# Patient Record
Sex: Male | Born: 1965 | Race: White | Hispanic: No | Marital: Married | Smoking: Never smoker
Health system: Southern US, Community
[De-identification: ages and names within clinical notes are randomized; demographics above are authoritative.]

## PROBLEM LIST (undated history)

## (undated) DIAGNOSIS — G4730 Sleep apnea, unspecified: Secondary | ICD-10-CM

## (undated) DIAGNOSIS — F419 Anxiety disorder, unspecified: Secondary | ICD-10-CM

## (undated) DIAGNOSIS — F32A Depression, unspecified: Secondary | ICD-10-CM

## (undated) DIAGNOSIS — K219 Gastro-esophageal reflux disease without esophagitis: Secondary | ICD-10-CM

## (undated) HISTORY — PX: SHOULDER SURGERY: SHX246

## (undated) HISTORY — PX: OTHER SURGICAL HISTORY: SHX169

---

## 2016-05-02 ENCOUNTER — Other Ambulatory Visit: Payer: Self-pay | Admitting: Family Medicine

## 2016-05-02 ENCOUNTER — Other Ambulatory Visit (HOSPITAL_COMMUNITY): Payer: Self-pay | Admitting: Family Medicine

## 2016-05-02 DIAGNOSIS — H532 Diplopia: Secondary | ICD-10-CM

## 2016-05-04 ENCOUNTER — Ambulatory Visit (HOSPITAL_COMMUNITY)
Admission: RE | Admit: 2016-05-04 | Discharge: 2016-05-04 | Disposition: A | Discharge status: Home / Self Care | Payer: BLUE CROSS/BLUE SHIELD | Source: Ambulatory Visit | Attending: Family Medicine | Admitting: Family Medicine

## 2016-05-04 DIAGNOSIS — H532 Diplopia: Secondary | ICD-10-CM | POA: Diagnosis not present

## 2016-05-04 MED ORDER — GADOBENATE DIMEGLUMINE 529 MG/ML IV SOLN
20.0000 mL | Freq: Once | INTRAVENOUS | Status: AC | PRN
  Administered 2016-05-04: 20 mL via INTRAVENOUS

## 2016-05-09 ENCOUNTER — Ambulatory Visit (HOSPITAL_COMMUNITY): Payer: Self-pay

## 2017-01-02 ENCOUNTER — Encounter: Payer: Self-pay | Admitting: Sports Medicine

## 2017-01-02 ENCOUNTER — Ambulatory Visit (INDEPENDENT_AMBULATORY_CARE_PROVIDER_SITE_OTHER): Payer: BLUE CROSS/BLUE SHIELD | Admitting: Sports Medicine

## 2017-01-02 VITALS — BP 120/80 | Ht 71.0 in | Wt 222.0 lb

## 2017-01-02 DIAGNOSIS — M25511 Pain in right shoulder: Secondary | ICD-10-CM | POA: Diagnosis not present

## 2017-01-02 NOTE — Progress Notes (Signed)
   Subjective:    Patient ID: Jerry Snyder, male    DOB: 1965/08/03, 51 y.o.   MRN: [id number]  HPI chief complaint: Right shoulder pain  Very pleasant 51 year old male comes in today complaining of 2 months of right shoulder pain. No injury that he can recall but gradual onset of pain that is most noticeable when holding his arm in an abducted and external rotated position. Pain is diffuse along the lateral shoulder. It also causes him some discomfort at night when sleeping on his right side. He does endorse occasional numbness into his forearm and hand but this does not typically occur at the same time that he has shoulder pain. The numbness typically occurs when holding his arm in a dependent position. He does endorse some mild weakness from time to time but that has been going on for several years. He has not had any imaging. His pain does not bother him enough to take any pain medication. He denies neck pain. No prior shoulder surgery. He is here today with his wife.  Past history reviewed Medications reviewed Allergies reviewed     Review of Systems As above     Objective:   Physical Exam  Well-developed, well-nourished. No acute distress. Awake alert and oriented 3. Vital signs reviewed. Sitting comfortable in exam room  Right shoulder: Full active and passive range of motion. Positive painful arc. No tenderness to palpation over the acromioclavicular joint nor at the bicipital groove. Rotator cuff strength is 5/5 in both shoulders. Mildly positive empty can, positive Hawkins. Negative O'Brien's.  Neurological exam: Strength is 5/5 in both upper extremities. No atrophy. 2+ reflexes at the triceps and brachial radialis tendons bilaterally. 2+ reflex at the left biceps and 1+ at the left. Sensation intact to light touch grossly.      Assessment & Plan:  Right shoulder pain likely secondary to rotator cuff impingement Intermittent right arm numbness   I think the patient has 2  separate issues. I'm going to give him a home exercise program consisting of Jobe exercises and scapular stabilization exercises. He will follow-up with me in 3 weeks for reevaluation. If symptoms persist then I would start with getting x-rays of both the cervical spine and the right shoulder. We also discussed the possibility of ultrasound or MRI if symptoms do not improve. He would like to have these done by the end of the year if needed. He will call me with questions or concerns prior to his follow-up visit.

## 2017-01-23 ENCOUNTER — Ambulatory Visit (INDEPENDENT_AMBULATORY_CARE_PROVIDER_SITE_OTHER): Payer: BLUE CROSS/BLUE SHIELD | Admitting: Sports Medicine

## 2017-01-23 ENCOUNTER — Ambulatory Visit
Admission: RE | Admit: 2017-01-23 | Discharge: 2017-01-23 | Disposition: A | Discharge status: Home / Self Care | Payer: BLUE CROSS/BLUE SHIELD | Source: Ambulatory Visit | Attending: Sports Medicine | Admitting: Sports Medicine

## 2017-01-23 ENCOUNTER — Encounter: Payer: Self-pay | Admitting: Sports Medicine

## 2017-01-23 VITALS — BP 120/86 | Ht 71.0 in | Wt 222.0 lb

## 2017-01-23 DIAGNOSIS — M25511 Pain in right shoulder: Secondary | ICD-10-CM

## 2017-01-23 NOTE — Progress Notes (Signed)
   Subjective:    Patient ID: Jerry Snyder, male    DOB: 09-02-1965, 51 y.o.   MRN: [id number]  HPI Chief Complaint:  Right shoulder pain [name] is a 51 year old male here for follow-up on right shoulder pain that has been present for the past 3 months. In the past month he has tried shoulder exercises which he states exacerbated his pain. He reports that after he did 6 out of 9 exercises he had pain for about 5 days for which he used Advil to help with symptoms.  His pain is reporducible with movement and improves when keeping his shoulder immobile.  He denies trauma to the area.     Review of Systems  Musculoskeletal: Negative for joint swelling and neck pain.  Skin: Negative for rash.  Neurological: Negative for weakness and numbness.       Objective:   Physical Exam  Constitutional: He appears well-developed and well-nourished. No distress.  Musculoskeletal:  Right shoulder with full range of motion on active and passive exam.  Positive O'Brien test.  Pain painful arc.  Mildly positive empty can test   Neurological:  2+ reflexes in brachial radialis, biceps and triceps tendons bilaterally  5/5 motor strength in upper and lower extremities bilaterally        Assessment & Plan:   Assessment:  Right shoulder pain Patient was evaluated on 01/02/17 for this issue.  He tried the exercises recommended at the time with worsening of his symptoms.  With positive O'Brien test today patient may have a labrum dysfunction and will need further imaging to better assess degree of dysfunction.  Patient has 5/5 motor strength and 2+ reflexes in upper extremities bilaterally, not likely a nerve impingement as pain is also intermittent and exacerbated with movement, specifically against resistance.  Will start with x-rays of right shoulder and likely proceed with MRI for better assessment.  Plan -Plain films of right shoulder - MRI of right shoulder

## 2017-01-31 ENCOUNTER — Ambulatory Visit
Admission: RE | Admit: 2017-01-31 | Discharge: 2017-01-31 | Disposition: A | Discharge status: Home / Self Care | Payer: BLUE CROSS/BLUE SHIELD | Source: Ambulatory Visit | Attending: Sports Medicine | Admitting: Sports Medicine

## 2017-01-31 DIAGNOSIS — M25511 Pain in right shoulder: Secondary | ICD-10-CM

## 2017-02-05 ENCOUNTER — Telehealth: Payer: Self-pay | Admitting: Sports Medicine

## 2017-02-05 NOTE — Telephone Encounter (Signed)
  Spoke with Tammy SoursGreg on the phone after reviewing the MRI of his right shoulder. No evidence of rotator cuff tear. No obvious labral tear. He does have moderate rotator cuff tendinosis and subacromial bursitis. Patient will return to the office soon for a subacromial cortisone injection and to start formal physical therapy.

## 2017-02-06 ENCOUNTER — Encounter: Payer: Self-pay | Admitting: Sports Medicine

## 2017-02-06 ENCOUNTER — Ambulatory Visit (INDEPENDENT_AMBULATORY_CARE_PROVIDER_SITE_OTHER): Payer: BLUE CROSS/BLUE SHIELD | Admitting: Sports Medicine

## 2017-02-06 VITALS — BP 133/91 | Ht 72.0 in | Wt 216.0 lb

## 2017-02-06 DIAGNOSIS — M7581 Other shoulder lesions, right shoulder: Secondary | ICD-10-CM | POA: Diagnosis not present

## 2017-02-06 MED ORDER — METHYLPREDNISOLONE ACETATE 40 MG/ML IJ SUSP
40.0000 mg | Freq: Once | INTRAMUSCULAR | Status: AC
  Administered 2017-02-06: 40 mg via INTRA_ARTICULAR

## 2017-02-06 NOTE — Progress Notes (Signed)
  Patient presents to the office today for a subacromial cortisone injection into his right shoulder. Please see his previous office notes as well as the telephone note from yesterday for details regarding history, physical exam findings, and MRI findings. Patient will follow-up with me again in 4 weeks for reevaluation. We are going to start some formal physical therapy. If symptoms persist, consider referral to orthopedics to discuss possible rotator cuff debridement and acromioplasty.  Consent obtained and verified. Time-out conducted. Noted no overlying erythema, induration, or other signs of local infection. Skin prepped in a sterile fashion. Topical analgesic spray: Ethyl chloride. Joint: right shoulder (subacromial space) Needle: 25g 1.5 inch Completed without difficulty. Meds: 3cc 1% xylocaine, 1cc (40mg ) depomedrol  Advised to call if fevers/chills, erythema, induration, drainage, or persistent bleeding.

## 2017-03-06 ENCOUNTER — Ambulatory Visit: Payer: BLUE CROSS/BLUE SHIELD | Admitting: Sports Medicine

## 2017-03-24 ENCOUNTER — Ambulatory Visit: Payer: BLUE CROSS/BLUE SHIELD | Admitting: Sports Medicine

## 2017-04-24 ENCOUNTER — Ambulatory Visit: Payer: BLUE CROSS/BLUE SHIELD | Admitting: Sports Medicine

## 2017-04-29 ENCOUNTER — Ambulatory Visit: Payer: BLUE CROSS/BLUE SHIELD | Admitting: Sports Medicine

## 2017-05-19 ENCOUNTER — Ambulatory Visit (INDEPENDENT_AMBULATORY_CARE_PROVIDER_SITE_OTHER): Payer: BLUE CROSS/BLUE SHIELD | Admitting: Sports Medicine

## 2017-05-19 ENCOUNTER — Encounter (INDEPENDENT_AMBULATORY_CARE_PROVIDER_SITE_OTHER): Payer: Self-pay

## 2017-05-19 VITALS — BP 122/82 | Ht 72.0 in | Wt 199.0 lb

## 2017-05-19 DIAGNOSIS — M7581 Other shoulder lesions, right shoulder: Secondary | ICD-10-CM | POA: Diagnosis not present

## 2017-05-19 NOTE — Progress Notes (Signed)
   Subjective:    Patient ID: Jerry Snyder, male    DOB: 1965/04/15, 52 y.o.   MRN: [id number]  HPI   Patient comes in today for follow-up on right shoulder rotator cuff tendinopathy. Previous MRI showed moderate tendinosis of the rotator cuff and mild tendinosis of the biceps tendon but no rotator cuff tear. He continues to have pain with activity as well as nighttime pain.Subacromial cortisone injection was minimally effective. Physical therapy has not been effective either. He is ready to consider surgical options. He is also complaining of some right great toe pain.He started walking recently and has lost 20 pounds as a result. Over the past year or so he's had pain over the dorsal aspect of the first MTP joint of the right foot. No swelling. No erythema. No trauma. No real treatment to date. No prior toe surgery.    Review of Systems    as above Objective:   Physical Exam  Well-developed, well-nourished. No acute distress. Vital signs reviewed  Right shoulder: Full range of motion. Good rotator cuff strength. Mildly positive empty can, positive Hawkins. No atrophy. Neurovascularly intact distally.  Right foot: First MTP joint demonstrates limited active and passive range of motion. There is a tender, palpable spur off of the dorsum of the MTP joint.No soft tissue swelling. No effusion. No erythema.      Assessment & Plan:   Chronic right shoulder pain secondary to rotator cuff tendinopathy Hallux rigidus, right MTP joint  Patient has failed conservative treatment thus far in regards to his right shoulder. I will refer him to Dr. Thurston HoleWainer to discussed the merits of arthroscopic rotator cuff debridement. For his hallux rigidus, he will return to the office with his walking shoes and we will put a first ray post on his insert.

## 2017-05-19 NOTE — Patient Instructions (Signed)
[facility] [address] [place], [place] [phone]  Appt: 05/22/17 at 3:15 pm with Dr. Thurston HoleWainer

## 2018-12-29 ENCOUNTER — Ambulatory Visit (INDEPENDENT_AMBULATORY_CARE_PROVIDER_SITE_OTHER): Payer: BC Managed Care – PPO | Admitting: Sports Medicine

## 2018-12-29 ENCOUNTER — Other Ambulatory Visit: Payer: Self-pay

## 2018-12-29 VITALS — BP 114/80 | Ht 72.0 in | Wt 200.0 lb

## 2018-12-29 DIAGNOSIS — M25511 Pain in right shoulder: Secondary | ICD-10-CM

## 2018-12-29 NOTE — Patient Instructions (Signed)
Voltaren Gel

## 2018-12-30 ENCOUNTER — Encounter: Payer: Self-pay | Admitting: Sports Medicine

## 2018-12-30 NOTE — Progress Notes (Signed)
   Subjective:    Patient ID: Jerry Snyder, male    DOB: 12/18/1965, 53 y.o.   MRN: [id number]  HPI chief complaint: Right shoulder and left middle finger pain  Jerry Snyder comes in today with a couple of different complaints.  Main complaint is right shoulder pain.  He is status post right shoulder arthroscopy done by Dr. Noemi Chapel in 2019 for rotator cuff tendinopathy.  He has done very well postoperatively.  Has been pain-free up until 3 days ago when he "tweaked" his shoulder while moving a heavy branch.  His main complaint is pain at night when sleeping on his side with the arm and shoulder in an abducted position.  He does notice some pain during the day but it is not severe.  He originally noticed some weakness but that has resolved.  He denies numbness or tingling.  He recently purchased a new house and has been doing quite a bit of self renovation on it.  He localizes his pain to the lateral shoulder.  Mild pain in the left shoulder as well. In regards to his left middle finger he is complaining of pain at the PIP joint.  He admits that he likes to crack his knuckles but this makes his discomfort feel better.  He is questioning whether he might have a little bit of early arthritis here.  He has similar pain in his right great toe.  Interim medical history reviewed Medications reviewed Allergies reviewed  Review of Systems    As above Objective:   Physical Exam  Well-developed, well-nourished.  No acute distress.  Awake alert and oriented x3.  Vital signs reviewed.  Right shoulder: Full active and passive range of motion.  No tenderness to palpation.  Negative empty can, negative Hawkins.  Rotator cuff strength is 5/5 and does not reproduce pain.  He does have pain with O'Brien's.  Negative clunk. Neurological exam: Patient has full strength in both upper extremities.  Left finger: Very minimal fusiform swelling at the PIP joint.  Full range of motion.  No effusion.  No tenderness to  palpation.  Skin is intact.  Brisk capillary refill.      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  I recommended a watchful waiting approach at this time.  I have reassured him that his rotator cuff seems to be fine.  However, if he continues to have pain he will return to the office in a week or 2 for an MSK ultrasound of his left shoulder.  For his left middle finger pain I recommended over-the-counter Voltaren gel as needed.  If symptoms worsen then I would consider an x-ray.  Patient will follow-up for ongoing or recalcitrant issues.

## 2019-06-24 ENCOUNTER — Other Ambulatory Visit: Payer: Self-pay

## 2019-06-24 ENCOUNTER — Ambulatory Visit: Payer: BC Managed Care – PPO | Admitting: Sports Medicine

## 2019-06-24 VITALS — BP 120/82 | Ht 72.0 in | Wt 207.0 lb

## 2019-06-24 DIAGNOSIS — M5010 Cervical disc disorder with radiculopathy, unspecified cervical region: Secondary | ICD-10-CM | POA: Diagnosis not present

## 2019-06-24 MED ORDER — PREDNISONE 10 MG PO TABS: ORAL_TABLET | ORAL | 0 refills | Status: DC

## 2019-06-24 NOTE — Progress Notes (Signed)
   Subjective:    Patient ID: Jerry Snyder, male    DOB: [birth year], 54 y.o.   MRN: [id number]  HPI chief complaint: Right shoulder and elbow pain  [name] comes in today complaining of 6 weeks of right shoulder and elbow pain.  No specific injury that he can recall but a gradual onset of pain that is most noticeable with activity.  Specifically, he notes that if he is lifting heavy objects, after a period of time, he will begin to experience aching and discomfort in the right shoulder which will radiate down to the right elbow and into the radial side of the right forearm and wrist.  He does endorse some numbness and tingling with this as well.  He is status post right shoulder rotator cuff repair done several years ago by Dr. Thurston Hole.  He has done well postoperatively.    Review of Systems As above    Objective:   Physical Exam  Well-developed, well-nourished.  No acute distress.  Awake alert and oriented x3.  Vital signs reviewed.  Cervical spine: Good cervical range of motion.  Positive Spurling's to the right.  Right shoulder: Full painless shoulder range of motion.  Rotator cuff strength is 5/5 but does reproduce some mild pain in the right shoulder.  Right elbow: Full painless range of motion.  No effusion.  No tenderness to palpation.  Neurological exam: Strength is 5/5 both upper extremities.  Reflexes are trace but equal at the biceps, triceps, and brachioradialis tendons.  No atrophy.      Assessment & Plan:   Right arm and elbow pain-question cervical radiculopathy  Patient's rotator cuff strength is very good.  He has full painless shoulder range of motion.  Positive Spurling's would suggest cervical radiculopathy.  I am going to put him on a 6-day Sterapred Dosepak and start physical therapy.  He will follow up with me again in 3 weeks for reevaluation.  If symptoms persist, consider merits of diagnostic/therapeutic right shoulder subacromial cortisone injection versus  imaging.  Call with questions or concerns in the interim.

## 2019-07-15 ENCOUNTER — Ambulatory Visit: Payer: BC Managed Care – PPO | Admitting: Sports Medicine

## 2019-09-16 ENCOUNTER — Other Ambulatory Visit: Payer: Self-pay | Admitting: Student

## 2019-09-16 DIAGNOSIS — R140 Abdominal distension (gaseous): Secondary | ICD-10-CM

## 2019-09-16 DIAGNOSIS — R748 Abnormal levels of other serum enzymes: Secondary | ICD-10-CM

## 2019-09-20 ENCOUNTER — Ambulatory Visit
Admission: RE | Admit: 2019-09-20 | Discharge: 2019-09-20 | Disposition: A | Discharge status: Home / Self Care | Payer: BC Managed Care – PPO | Source: Ambulatory Visit | Attending: Student | Admitting: Student

## 2019-09-20 ENCOUNTER — Other Ambulatory Visit: Payer: Self-pay

## 2019-09-20 DIAGNOSIS — R140 Abdominal distension (gaseous): Secondary | ICD-10-CM

## 2019-09-20 DIAGNOSIS — R748 Abnormal levels of other serum enzymes: Secondary | ICD-10-CM | POA: Insufficient documentation

## 2020-12-07 IMAGING — US US ABDOMEN LIMITED
1 series · 14 of 25 positions shown · non-contrast
Comparison: None.

CLINICAL DATA: Abdominal bloating, elevated LFTs

EXAM:
ULTRASOUND ABDOMEN LIMITED RIGHT UPPER QUADRANT

[Series 1: us abdomen limited ruq · 14 of 53 slices shown]
[im 1/53]
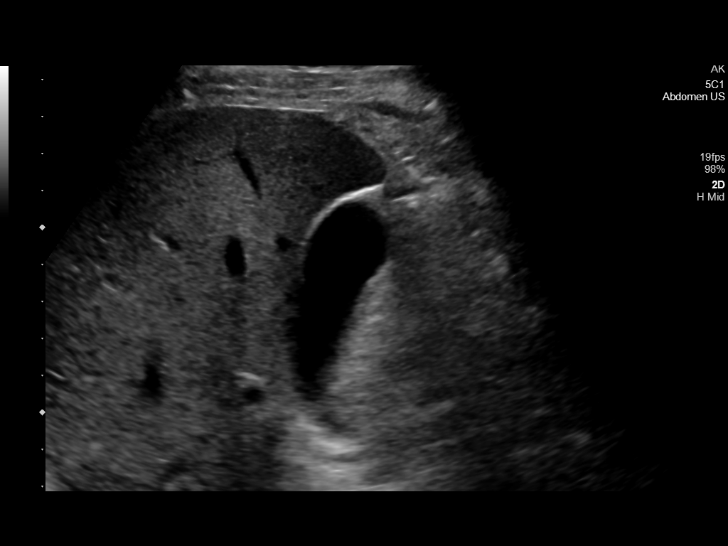
[im 5/53]
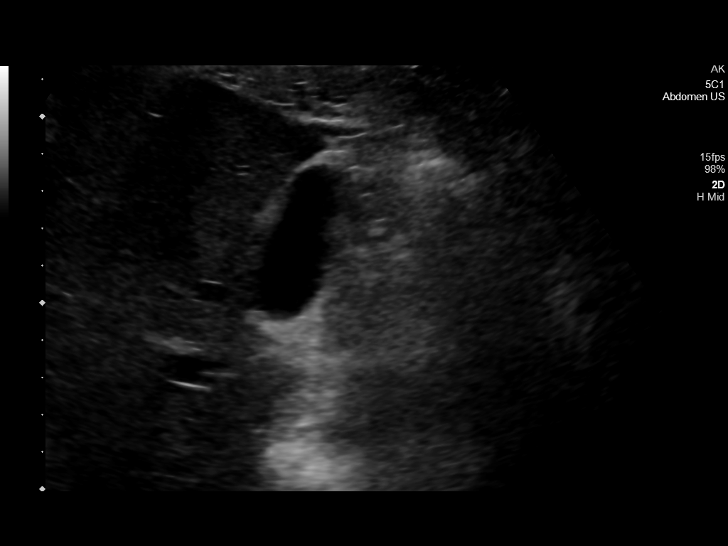
[im 9/53]
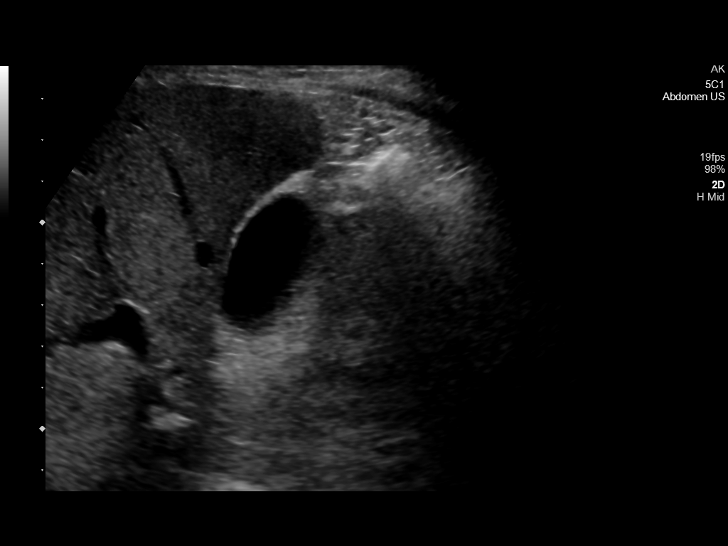
[im 14/53]
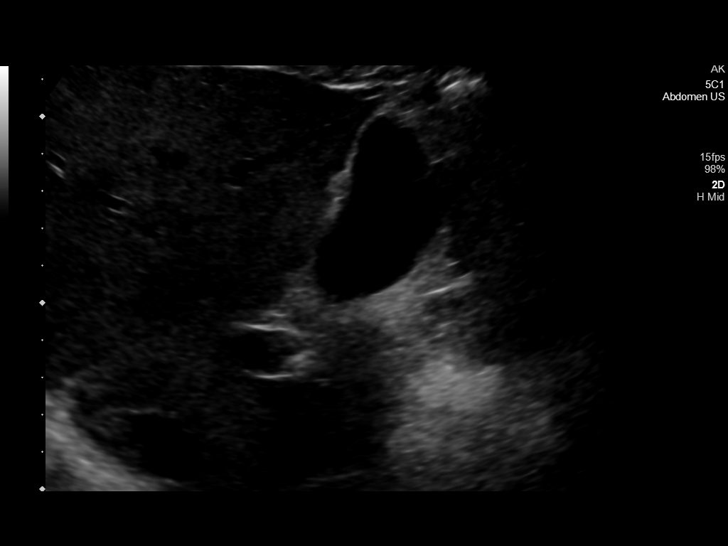
[im 18/53]
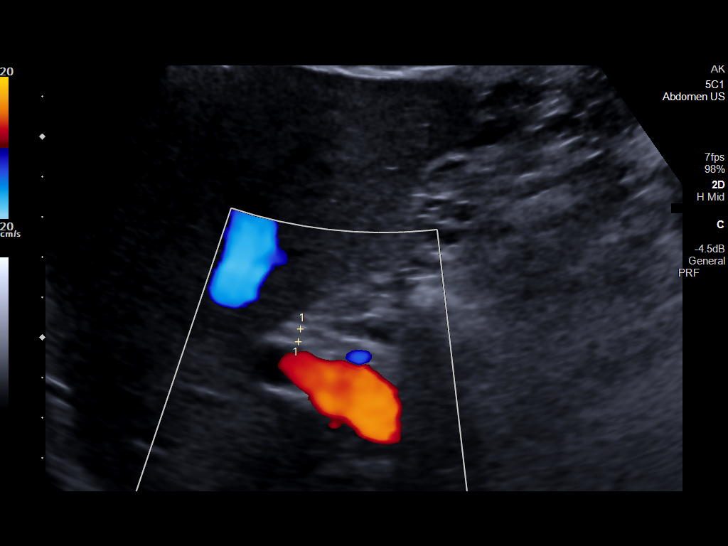
[im 20/53]
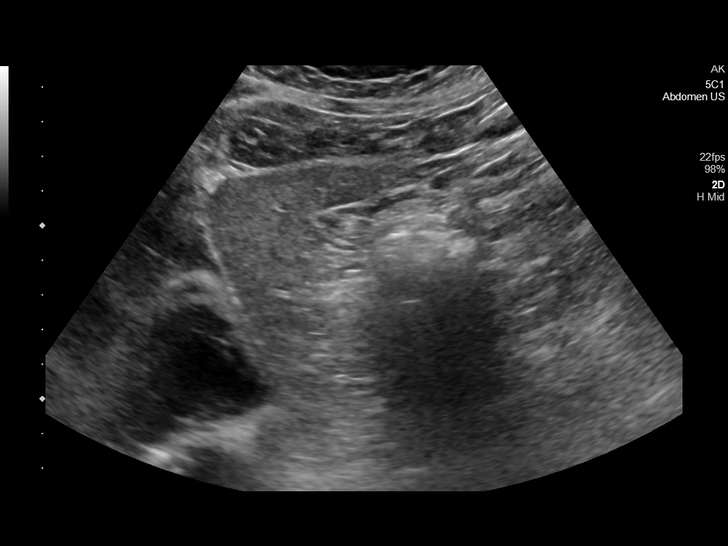
[im 24/53]
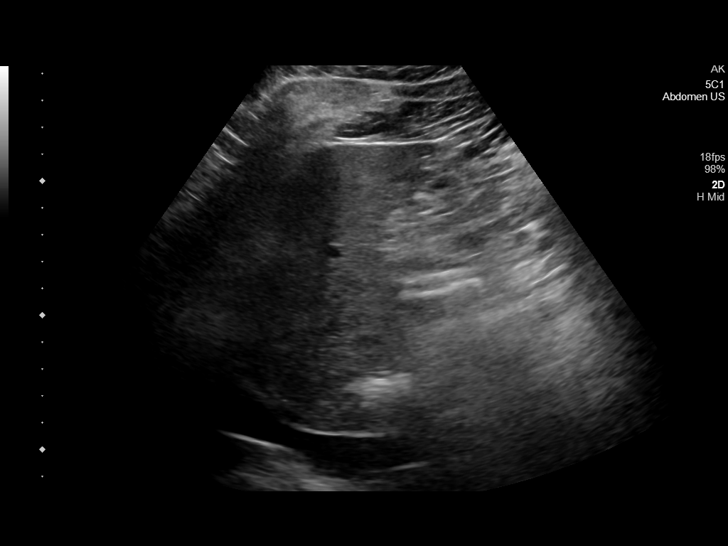
[im 29/53]
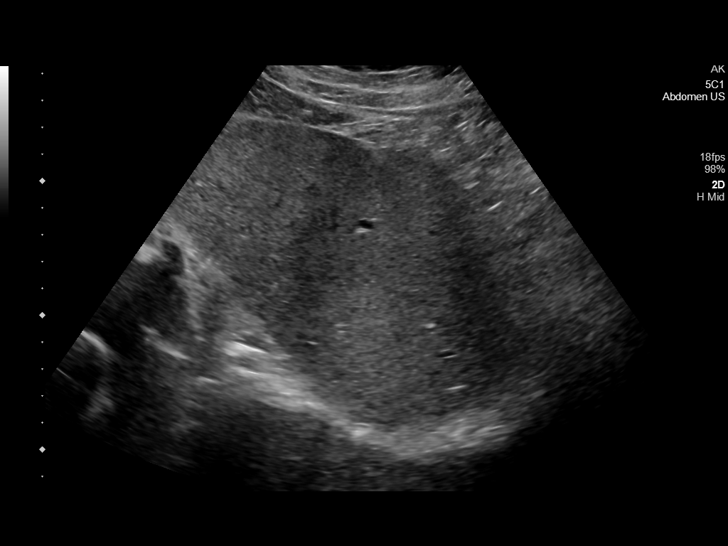
[im 33/53]
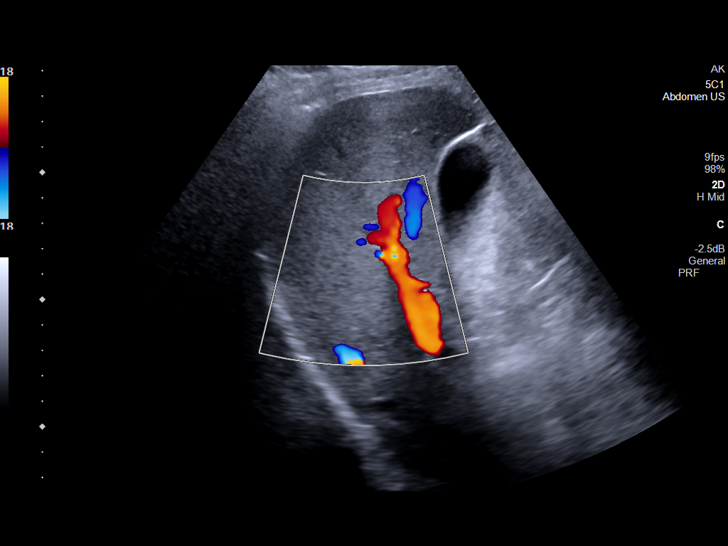
[im 35/53]
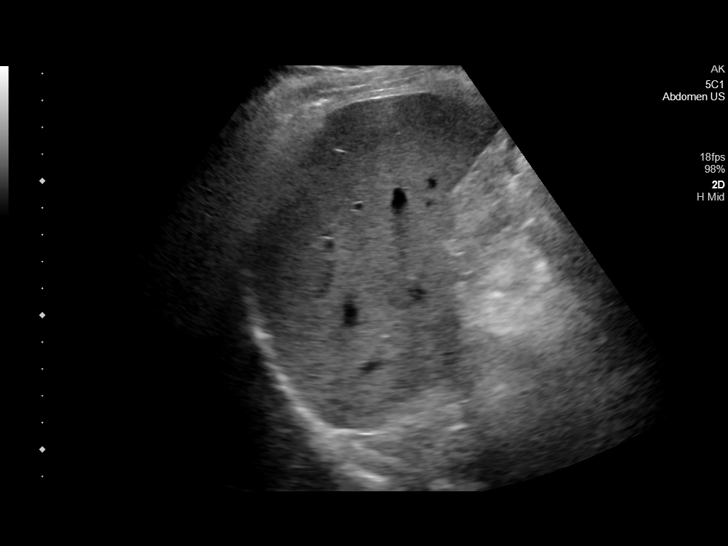
[im 40/53]
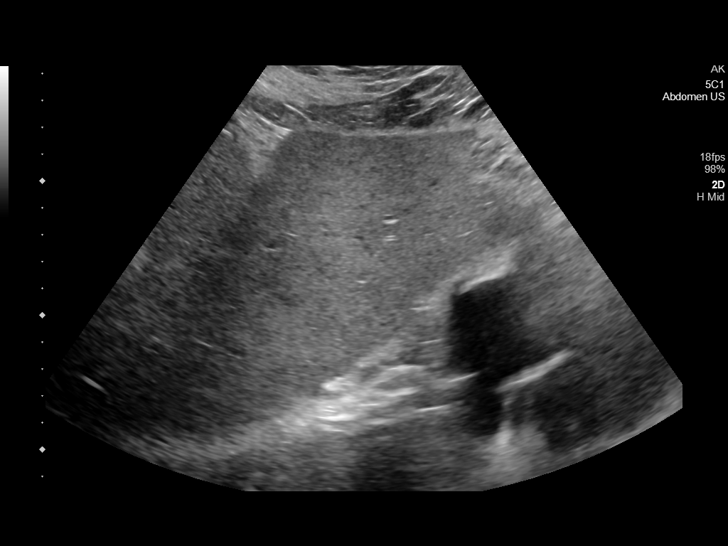
[im 44/53]
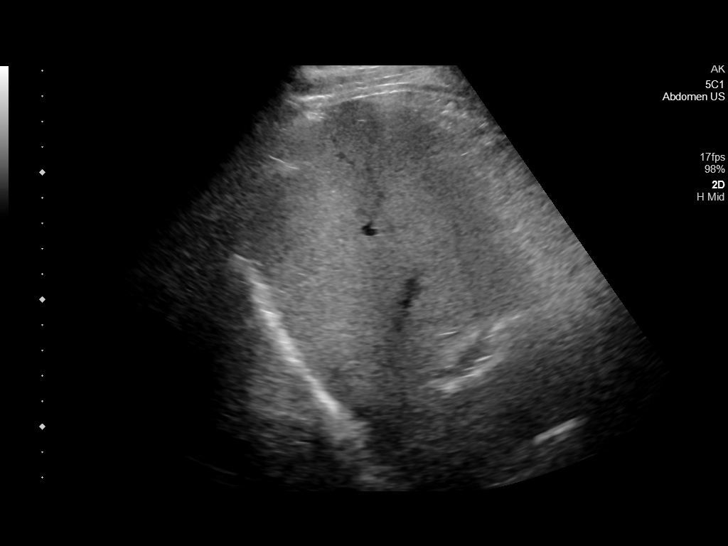
[im 48/53]
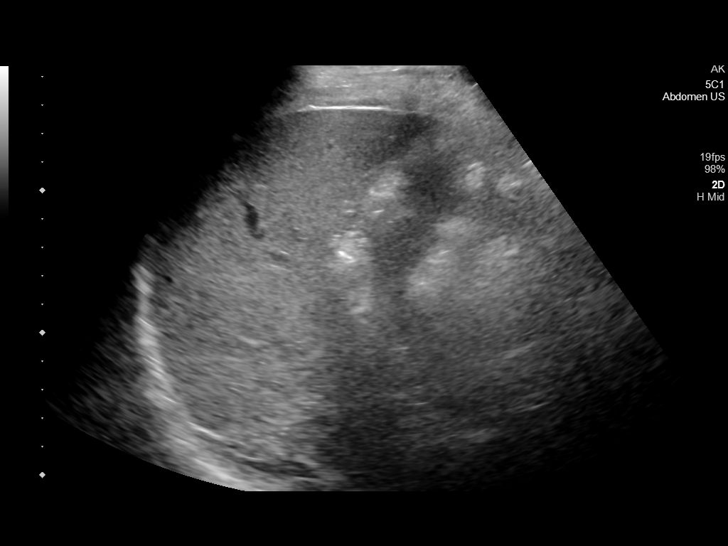
[im 53/53]
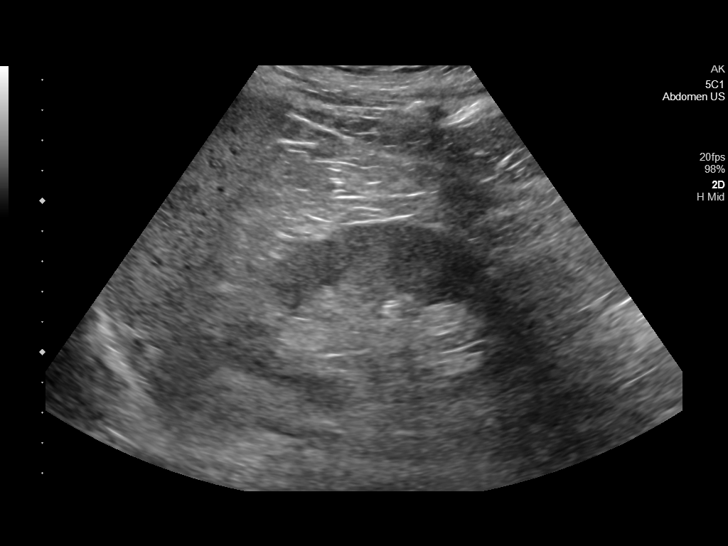

[14 of 25 positions shown; findings below may reference images not displayed]

FINDINGS: Gallbladder:

No gallstones or wall thickening visualized. No sonographic Murphy
sign noted by sonographer.

Common bile duct:

Diameter: 3.2 mm

Liver:

Increased echogenicity compatible with hepatic steatosis. No focal
abnormality or intrahepatic biliary dilatation. Portal vein is
patent on color Doppler imaging with normal direction of blood flow
towards the liver.

Other: No free fluid or ascites
IMPRESSION: Hepatic steatosis.  No other acute finding by ultrasound.

## 2021-01-25 ENCOUNTER — Ambulatory Visit (INDEPENDENT_AMBULATORY_CARE_PROVIDER_SITE_OTHER): Payer: 59 | Admitting: Sports Medicine

## 2021-01-25 VITALS — BP 122/88 | Ht 72.0 in | Wt 216.0 lb

## 2021-01-25 DIAGNOSIS — M25561 Pain in right knee: Secondary | ICD-10-CM

## 2021-01-25 MED ORDER — MELOXICAM 15 MG PO TABS
ORAL_TABLET | ORAL | 0 refills | Status: DC
Start: 2021-01-25 — End: 2021-12-06

## 2021-01-25 MED ORDER — CYCLOBENZAPRINE HCL 10 MG PO TABS
ORAL_TABLET | ORAL | 0 refills | Status: DC
Start: 2021-01-25 — End: 2021-03-22

## 2021-01-25 NOTE — Progress Notes (Signed)
   Subjective:    Patient ID: Jerry Snyder, male    DOB: May 18, 1965, 55 y.o.   MRN: [id number]  HPI chief complaint: Right leg and knee pain  Comes in today complaining of right leg and knee pain that began 2 weeks ago with an injury.  He was cutting branches from a tree when 1 of those branches struck him directly across the anterior lateral upper thigh area.  He had significant swelling and bruising in that same area at the time.  Swelling has improved but he is experiencing pain more distally in his knee. He is able to ambulate.  In fact he was able to continue trimming the tree shortly after his injury.  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  Interim medical history reviewed Medications reviewed Allergies reviewed    Review of Systems As above    Objective:   Physical Exam  Well-developed, well-nourished.  No acute distress  Right leg: There is a small area of ecchymosis along the proximal anterior lateral thigh area.  Slight soft tissue swelling but no palpable hematoma.  There is some slight ecchymosis distal to this in the thigh as well as around the medial knee.  He is tender to palpation in the distal VMO area.  No tenderness to palpation along the medial or lateral joint lines.  The knee itself demonstrates full painless range of motion.  No effusion.  Good joint stability.  Trace patellofemoral crepitus.  Neurovascularly intact distally.  He is walking without a noticeable limp.  MSK ultrasound of the right leg and knee was performed.  There is a very small area of resolving hematoma just distal to the ecchymotic area on his thigh.  However, there is no large hypoechoic area to suggest seroma or not resolving hematoma.  Ultrasound of the knee shows no effusion.  Visualized medial meniscus is unremarkable.      Assessment & Plan:   Right knee pain secondary to hematoma versus  DJD   We will try meloxicam 15 mg daily with food for 7 days.  I will also give him a prescription for Flexeril to take at night with instructions to take .05-1 pills as needed.  Body helix compression sleeve for the right knee to be worn when active.  If symptoms persist as hematoma continues to resolve then consider merits of diagnostic/therapeutic intra-articular cortisone injection into the right knee.  Follow-up for ongoing or recalcitrant issues.  This note was dictated using Dragon naturally speaking software and may contain errors in syntax, spelling, or content which have not been identified prior to signing this note.

## 2021-03-22 ENCOUNTER — Ambulatory Visit
Admission: RE | Admit: 2021-03-22 | Discharge: 2021-03-22 | Disposition: A | Discharge status: Home / Self Care | Payer: 59 | Source: Ambulatory Visit | Attending: Sports Medicine | Admitting: Sports Medicine

## 2021-03-22 ENCOUNTER — Ambulatory Visit: Payer: 59 | Admitting: Sports Medicine

## 2021-03-22 VITALS — BP 142/94 | Ht 72.0 in | Wt 215.0 lb

## 2021-03-22 DIAGNOSIS — M5412 Radiculopathy, cervical region: Secondary | ICD-10-CM

## 2021-03-22 MED ORDER — CYCLOBENZAPRINE HCL 10 MG PO TABS: ORAL_TABLET | ORAL | 0 refills | Status: AC

## 2021-03-22 MED ORDER — PREDNISONE 10 MG PO TABS: ORAL_TABLET | ORAL | 0 refills | Status: DC

## 2021-03-22 NOTE — Progress Notes (Signed)
° °  Subjective:    Patient ID: Jerry Snyder, male    DOB: Jun 21, 1965, 56 y.o.   MRN: [id number]  HPI Patient with history of prior right shoulder arthroscopic surgery presents for new onset right shoulder pain that started a few weeks ago. Endorses consistent pain that is worse with movement but the pain seems to be progressively getting worse and is now starting to experience numbness and tingling in his thumb. Pain starts at the right shoulder and neck but radiates down his hand to his fingers. The pain has progressed so much that he finds it difficult to concentrate and sleep at night. Denies any known trauma or injury although when the pain first developed, he was painting. He has tried ibuprofen and muscle relaxers along with hot showers but nothing seems to provide long-term relief.    Review of Systems As above     Objective:   Physical Exam General: Patient well-appearing, in no acute distress.  MSK: Inspection reveals no gross deformity, erythema or edema of right shoulder and neck.  Palpation reveals no tenderness upon deep palpation of rotator cuff and deltoid.  Full active range of motion noted. Special testing: positive Spurling's, Neer and Hawkin's, positive empty can testing, negative Tinel's and Phalen's Neuro: gross sensation intact although increased sensation along palmar aspect of 1st metacarpal, 5/5 UE strength and grip strength bilaterally, interosseous strength intact with the exception of weakness of right thumb abduction, weakness with resistance to extension in right wrist flexion, 2+ brachioradialis and triceps reflex, trace right biceps reflex, 1+ left biceps reflex    Assessment & Plan:  Right shoulder pain: Concern that shoulder pain emerging from possible cervical radiculopathy as this seems most consistent with history and neurological exam. Will obtain radiographic imaging. Prescribed short course of steroid treatment along with flexeril refill provided.  Instructed to avoid overhead activities and other movements that may further exacerbate symptoms. Plan to follow up in 1 week to review imaging and monitor progression, if symptoms persist then consider obtaining MRI at next visit.   Patient seen and evaluated with the resident.  I agree with the above plan of care.  Patient's symptoms are consistent with cervical radiculopathy into the right arm, likely from the C6 nerve root based on his some numbness.  Treatment as above including x-rays.  Follow-up with me in 1 week if symptoms persist.  Consider further diagnostic imaging in the form of MRI if that is the case.  Addendum: X-rays show multilevel lower cervical disc degeneration.  Nothing acute.

## 2021-03-22 NOTE — Patient Instructions (Signed)
It was great seeing you today!  Today we discussed your right shoulder pain, we think this is due to your neck. We will get an x-ray. Please take the short prednisone course for the next 6 days. You may also take flexeril as well. Try to avoid any overhead activities and overusing your arm.   Please follow up at your next scheduled appointment in 1 week.  Thank you for allowing [place] to be a part of your medical care!  Thank you, Dr. Robyne Peers and Dr. Margaretha Sheffield

## 2021-03-26 ENCOUNTER — Other Ambulatory Visit: Payer: Self-pay | Admitting: Family Medicine

## 2021-03-26 MED ORDER — HYDROCODONE-ACETAMINOPHEN 5-325 MG PO TABS: 1.0000 | ORAL_TABLET | Freq: Four times a day (QID) | ORAL | 0 refills | Status: DC | PRN

## 2021-03-26 NOTE — Progress Notes (Signed)
Patient with severe pain - saw Dr. Margaretha Sheffield for this, see recent note.  Will send in hydrocodone.  Database reviewed.

## 2021-03-27 NOTE — Progress Notes (Unsigned)
Spoke with pt regarding recent x-ray results. He states the prednisone isn't helping and he's in severe pain. Per Dr. Micheline Chapman we will try a short course of hydrocodone and expedite a referral to Dr. Lynann Bologna and/or MRI of cervical spine.  Appt with Dr. Lynann Bologna 04/02/21 @ 1030 am.

## 2021-03-28 ENCOUNTER — Ambulatory Visit: Payer: 59 | Admitting: Family Medicine

## 2021-03-29 ENCOUNTER — Ambulatory Visit: Payer: 59 | Admitting: Sports Medicine

## 2021-04-03 ENCOUNTER — Ambulatory Visit: Payer: 59 | Admitting: Sports Medicine

## 2021-12-06 ENCOUNTER — Encounter
Admission: RE | Admit: 2021-12-06 | Discharge: 2021-12-06 | Disposition: A | Discharge status: Home / Self Care | Payer: 59 | Source: Ambulatory Visit | Attending: Otolaryngology | Admitting: Otolaryngology

## 2021-12-06 ENCOUNTER — Other Ambulatory Visit: Payer: Self-pay

## 2021-12-06 HISTORY — DX: Sleep apnea, unspecified: G47.30

## 2021-12-06 HISTORY — DX: Anxiety disorder, unspecified: F41.9

## 2021-12-06 HISTORY — DX: Depression, unspecified: F32.A

## 2021-12-06 HISTORY — DX: Gastro-esophageal reflux disease without esophagitis: K21.9

## 2021-12-06 NOTE — Patient Instructions (Signed)
Your procedure is scheduled on: 12/12/21 Report to [facility]. To find out your arrival time please call [phone] between 1PM - 3PM on 12/11/21.  Remember: Instructions that are not followed completely may result in serious medical risk, up to and including death, or upon the discretion of your surgeon and anesthesiologist your surgery may need to be rescheduled.     _X__ 1. Do not eat food after midnight the night before your procedure.                 No gum chewing or hard candies. You may drink clear liquids up to 2 hours                 before you are scheduled to arrive for your surgery- DO not drink clear                 liquids within 2 hours of the start of your surgery.                 Clear Liquids include:  water, apple juice without pulp, clear carbohydrate                 drink such as Clearfast or Gatorade, Black Coffee or Tea (Do not add                 anything to coffee or tea). Diabetics water only  __X__2.  On the morning of surgery brush your teeth with toothpaste and water, you                 may rinse your mouth with mouthwash if you wish.  Do not swallow any              toothpaste of mouthwash.     _X__ 3.  No Alcohol for 24 hours before or after surgery.   _X__ 4.  Do Not Smoke or use e-cigarettes For 24 Hours Prior to Your Surgery.                 Do not use any chewable tobacco products for at least 6 hours prior to                 surgery.  ____  5.  Bring all medications with you on the day of surgery if instructed.   __X__  6.  Notify your doctor if there is any change in your medical condition      (cold, fever, infections).     Do not wear jewelry, make-up, hairpins, clips or nail polish. Do not wear lotions, powders, or perfumes. You may wear deodorant Do not shave body hair 48 hours prior to surgery. Men may shave face and neck. Do not bring valuables to the hospital.    [facility] is not  responsible for any belongings or valuables.  Contacts, dentures/partials or body piercings may not be worn into surgery. Bring a case for your contacts, glasses or hearing aids, a denture cup will be supplied. Leave your suitcase in the car. After surgery it may be brought to your room. For patients admitted to the hospital, discharge time is determined by your treatment team.   Patients discharged the day of surgery will not be allowed to drive home.     __X__ Take these medicines the morning of surgery with A SIP OF WATER:    1. escitalopram (LEXAPRO) 20 MG tablet  2.  omeprazole (PRILOSEC) 20 MG capsule  3. Allergy medication  4.  5.  6.  ____ Fleet Enema (as directed)   ____ Use CHG Soap/SAGE wipes as directed  ____ Use inhalers on the day of surgery  ____ Stop metformin/Janumet/Farxiga 2 days prior to surgery    ____ Take 1/2 of usual insulin dose the night before surgery. No insulin the morning          of surgery.   ____ Stop Blood Thinners Coumadin/Plavix/Xarelto/Pleta/Pradaxa/Eliquis/Effient/Aspirin  on   Or contact your Surgeon, Cardiologist or Medical Doctor regarding  ability to stop your blood thinners  __X__ Stop Anti-inflammatories 7 days before surgery such as Advil, Ibuprofen, Motrin,  BC or Goodies Powder, Naprosyn, Naproxen, Aleve, Aspirin    __X__ Stop all herbals and supplements, fish oil or vitamins  until after surgery.    ____ Bring C-Pap to the hospital.

## 2021-12-12 ENCOUNTER — Other Ambulatory Visit: Payer: Self-pay

## 2021-12-12 ENCOUNTER — Encounter
Admission: RE | Disposition: A | Discharge status: Home / Self Care | Payer: Self-pay | Source: Home / Self Care | Attending: Otolaryngology

## 2021-12-12 ENCOUNTER — Observation Stay
Admission: RE | Admit: 2021-12-12 | Discharge: 2021-12-13 | Disposition: A | Discharge status: Home / Self Care | Payer: 59 | Attending: Otolaryngology | Admitting: Otolaryngology

## 2021-12-12 ENCOUNTER — Ambulatory Visit: Payer: 59 | Admitting: Certified Registered"

## 2021-12-12 ENCOUNTER — Encounter: Payer: Self-pay | Admitting: Otolaryngology

## 2021-12-12 DIAGNOSIS — G4733 Obstructive sleep apnea (adult) (pediatric): Secondary | ICD-10-CM | POA: Diagnosis present

## 2021-12-12 DIAGNOSIS — J343 Hypertrophy of nasal turbinates: Secondary | ICD-10-CM | POA: Diagnosis present

## 2021-12-12 DIAGNOSIS — J342 Deviated nasal septum: Secondary | ICD-10-CM | POA: Insufficient documentation

## 2021-12-12 DIAGNOSIS — J351 Hypertrophy of tonsils: Secondary | ICD-10-CM | POA: Diagnosis not present

## 2021-12-12 HISTORY — PX: SEPTOPLASTY: SHX2393

## 2021-12-12 HISTORY — PX: NASAL TURBINATE REDUCTION: SHX2072

## 2021-12-12 HISTORY — PX: TONSILLECTOMY: SHX5217

## 2021-12-12 HISTORY — PX: UVULOPALATOPHARYNGOPLASTY: SHX827

## 2021-12-12 SURGERY — SEPTOPLASTY, NOSE
Anesthesia: General | Site: Nose

## 2021-12-12 MED ORDER — FENTANYL CITRATE (PF) 100 MCG/2ML IJ SOLN
25.0000 ug | INTRAMUSCULAR | Status: DC | PRN
  Administered 2021-12-12: 25 ug via INTRAVENOUS

## 2021-12-12 MED ORDER — HYDROCODONE-ACETAMINOPHEN 7.5-325 MG/15ML PO SOLN
ORAL | Status: AC
  Filled 2021-12-12: qty 15

## 2021-12-12 MED ORDER — PHENYLEPHRINE HCL (PRESSORS) 10 MG/ML IV SOLN
INTRAVENOUS | Status: DC | PRN
  Administered 2021-12-12 (×2): 160 ug via INTRAVENOUS

## 2021-12-12 MED ORDER — ENSURE ENLIVE PO LIQD: 237.0000 mL | Freq: Two times a day (BID) | ORAL | Status: DC

## 2021-12-12 MED ORDER — CEFAZOLIN SODIUM-DEXTROSE 2-3 GM-%(50ML) IV SOLR
INTRAVENOUS | Status: DC | PRN
  Administered 2021-12-12: 2 g via INTRAVENOUS

## 2021-12-12 MED ORDER — SUCCINYLCHOLINE CHLORIDE 200 MG/10ML IV SOSY
PREFILLED_SYRINGE | INTRAVENOUS | Status: AC
  Filled 2021-12-12: qty 10

## 2021-12-12 MED ORDER — LIDOCAINE-EPINEPHRINE (PF) 1 %-1:200000 IJ SOLN
INTRAMUSCULAR | Status: AC
  Filled 2021-12-12: qty 30

## 2021-12-12 MED ORDER — MORPHINE SULFATE (PF) 4 MG/ML IV SOLN
INTRAVENOUS | Status: AC
  Administered 2021-12-12: 3 mg via INTRAVENOUS
  Filled 2021-12-12: qty 1

## 2021-12-12 MED ORDER — PROPOFOL 10 MG/ML IV BOLUS
INTRAVENOUS | Status: AC
  Filled 2021-12-12: qty 40

## 2021-12-12 MED ORDER — MAGNESIUM HYDROXIDE 400 MG/5ML PO SUSP: 30.0000 mL | Freq: Every day | ORAL | Status: DC | PRN

## 2021-12-12 MED ORDER — ACETAMINOPHEN 10 MG/ML IV SOLN
INTRAVENOUS | Status: DC | PRN
  Administered 2021-12-12: 1000 mg via INTRAVENOUS

## 2021-12-12 MED ORDER — PROPOFOL 10 MG/ML IV BOLUS
INTRAVENOUS | Status: DC | PRN
  Administered 2021-12-12: 150 mg via INTRAVENOUS

## 2021-12-12 MED ORDER — ONDANSETRON 4 MG PO TBDP: 4.0000 mg | ORAL_TABLET | Freq: Four times a day (QID) | ORAL | Status: DC | PRN

## 2021-12-12 MED ORDER — ONDANSETRON HCL 4 MG/2ML IJ SOLN
4.0000 mg | Freq: Four times a day (QID) | INTRAMUSCULAR | Status: DC | PRN
  Administered 2021-12-12: 4 mg via INTRAVENOUS

## 2021-12-12 MED ORDER — LACTATED RINGERS IV SOLN: INTRAVENOUS | Status: DC

## 2021-12-12 MED ORDER — ROCURONIUM BROMIDE 10 MG/ML (PF) SYRINGE
PREFILLED_SYRINGE | INTRAVENOUS | Status: AC
  Filled 2021-12-12: qty 10

## 2021-12-12 MED ORDER — HYDROCODONE-ACETAMINOPHEN 7.5-325 MG/15ML PO SOLN
15.0000 mL | ORAL | Status: DC | PRN
  Administered 2021-12-12 – 2021-12-13 (×3): 15 mL via ORAL

## 2021-12-12 MED ORDER — EPHEDRINE SULFATE (PRESSORS) 50 MG/ML IJ SOLN
INTRAMUSCULAR | Status: DC | PRN
  Administered 2021-12-12: 10 mg via INTRAVENOUS

## 2021-12-12 MED ORDER — PHENYLEPHRINE HCL (PRESSORS) 10 MG/ML IV SOLN
INTRAVENOUS | Status: DC | PRN
  Administered 2021-12-12: 5 mL via INTRAMUSCULAR

## 2021-12-12 MED ORDER — DEXAMETHASONE SODIUM PHOSPHATE 10 MG/ML IJ SOLN
INTRAMUSCULAR | Status: DC | PRN
  Administered 2021-12-12: 10 mg via INTRAVENOUS

## 2021-12-12 MED ORDER — BACITRACIN ZINC 500 UNIT/GM EX OINT
TOPICAL_OINTMENT | CUTANEOUS | Status: DC | PRN
  Administered 2021-12-12: 1 via TOPICAL

## 2021-12-12 MED ORDER — FENTANYL CITRATE (PF) 100 MCG/2ML IJ SOLN
INTRAMUSCULAR | Status: DC | PRN
  Administered 2021-12-12 (×2): 50 ug via INTRAVENOUS

## 2021-12-12 MED ORDER — PHENYLEPHRINE HCL-NACL 20-0.9 MG/250ML-% IV SOLN
INTRAVENOUS | Status: AC
  Filled 2021-12-12: qty 250

## 2021-12-12 MED ORDER — ONDANSETRON HCL 4 MG/2ML IJ SOLN
INTRAMUSCULAR | Status: AC
  Filled 2021-12-12: qty 2

## 2021-12-12 MED ORDER — PROMETHAZINE HCL 25 MG/ML IJ SOLN: 6.2500 mg | INTRAMUSCULAR | Status: DC | PRN

## 2021-12-12 MED ORDER — MIDAZOLAM HCL 2 MG/2ML IJ SOLN
INTRAMUSCULAR | Status: DC | PRN
  Administered 2021-12-12: 2 mg via INTRAVENOUS

## 2021-12-12 MED ORDER — ONDANSETRON HCL 4 MG/2ML IJ SOLN
INTRAMUSCULAR | Status: DC | PRN
  Administered 2021-12-12: 4 mg via INTRAVENOUS

## 2021-12-12 MED ORDER — DEXAMETHASONE SODIUM PHOSPHATE 10 MG/ML IJ SOLN
INTRAMUSCULAR | Status: AC
  Filled 2021-12-12: qty 1

## 2021-12-12 MED ORDER — GLYCOPYRROLATE 0.2 MG/ML IJ SOLN
INTRAMUSCULAR | Status: DC | PRN
  Administered 2021-12-12: .1 mg via INTRAVENOUS

## 2021-12-12 MED ORDER — PHENYLEPHRINE HCL (PRESSORS) 10 MG/ML IV SOLN
INTRAVENOUS | Status: AC
  Filled 2021-12-12: qty 1

## 2021-12-12 MED ORDER — LIDOCAINE HCL (CARDIAC) PF 100 MG/5ML IV SOSY
PREFILLED_SYRINGE | INTRAVENOUS | Status: DC | PRN
  Administered 2021-12-12: 100 mg via INTRAVENOUS

## 2021-12-12 MED ORDER — OXYMETAZOLINE HCL 0.05 % NA SOLN
NASAL | Status: AC
  Filled 2021-12-12: qty 30

## 2021-12-12 MED ORDER — DEXTROSE-NACL 5-0.45 % IV SOLN: INTRAVENOUS | Status: DC

## 2021-12-12 MED ORDER — LIDOCAINE HCL URETHRAL/MUCOSAL 2 % EX GEL
CUTANEOUS | Status: AC
  Filled 2021-12-12: qty 6

## 2021-12-12 MED ORDER — DEXMEDETOMIDINE HCL IN NACL 80 MCG/20ML IV SOLN
INTRAVENOUS | Status: AC
  Filled 2021-12-12: qty 20

## 2021-12-12 MED ORDER — 0.9 % SODIUM CHLORIDE (POUR BTL) OPTIME
TOPICAL | Status: DC | PRN
  Administered 2021-12-12: 500 mL

## 2021-12-12 MED ORDER — CEFAZOLIN SODIUM 1 G IJ SOLR
INTRAMUSCULAR | Status: AC
  Filled 2021-12-12: qty 20

## 2021-12-12 MED ORDER — MIDAZOLAM HCL 2 MG/2ML IJ SOLN
INTRAMUSCULAR | Status: AC
  Filled 2021-12-12: qty 2

## 2021-12-12 MED ORDER — ROCURONIUM BROMIDE 100 MG/10ML IV SOLN
INTRAVENOUS | Status: DC | PRN
  Administered 2021-12-12: 10 mg via INTRAVENOUS
  Administered 2021-12-12: 60 mg via INTRAVENOUS
  Administered 2021-12-12 (×2): 10 mg via INTRAVENOUS

## 2021-12-12 MED ORDER — DEXMEDETOMIDINE HCL IN NACL 80 MCG/20ML IV SOLN
INTRAVENOUS | Status: DC | PRN
  Administered 2021-12-12: 4 ug via BUCCAL
  Administered 2021-12-12: 8 ug via BUCCAL
  Administered 2021-12-12 (×3): 4 ug via BUCCAL

## 2021-12-12 MED ORDER — BACITRACIN ZINC 500 UNIT/GM EX OINT
TOPICAL_OINTMENT | CUTANEOUS | Status: AC
  Filled 2021-12-12: qty 28.35

## 2021-12-12 MED ORDER — FENTANYL CITRATE (PF) 100 MCG/2ML IJ SOLN
INTRAMUSCULAR | Status: AC
  Administered 2021-12-12: 25 ug via INTRAVENOUS
  Filled 2021-12-12: qty 2

## 2021-12-12 MED ORDER — LIDOCAINE HCL (PF) 2 % IJ SOLN
INTRAMUSCULAR | Status: AC
  Filled 2021-12-12: qty 5

## 2021-12-12 MED ORDER — SUGAMMADEX SODIUM 200 MG/2ML IV SOLN
INTRAVENOUS | Status: DC | PRN
  Administered 2021-12-12: 200 mg via INTRAVENOUS

## 2021-12-12 MED ORDER — FLEET ENEMA 7-19 GM/118ML RE ENEM: 1.0000 | ENEMA | Freq: Once | RECTAL | Status: DC | PRN

## 2021-12-12 MED ORDER — BISACODYL 5 MG PO TBEC: 5.0000 mg | DELAYED_RELEASE_TABLET | Freq: Every day | ORAL | Status: DC | PRN

## 2021-12-12 MED ORDER — FENTANYL CITRATE (PF) 100 MCG/2ML IJ SOLN
INTRAMUSCULAR | Status: AC
  Filled 2021-12-12: qty 2

## 2021-12-12 MED ORDER — CHLORHEXIDINE GLUCONATE 0.12 % MT SOLN: 15.0000 mL | Freq: Once | OROMUCOSAL | Status: AC

## 2021-12-12 MED ORDER — ORAL CARE MOUTH RINSE: 15.0000 mL | Freq: Once | OROMUCOSAL | Status: AC

## 2021-12-12 MED ORDER — CHLORHEXIDINE GLUCONATE 0.12 % MT SOLN
OROMUCOSAL | Status: AC
  Administered 2021-12-12: 15 mL via OROMUCOSAL
  Filled 2021-12-12: qty 15

## 2021-12-12 MED ORDER — MORPHINE SULFATE (PF) 4 MG/ML IV SOLN: 3.0000 mg | INTRAVENOUS | Status: DC | PRN

## 2021-12-12 SURGICAL SUPPLY — 41 items
BASIN GRAD PLASTIC 32OZ STRL (MISCELLANEOUS) ×4 IMPLANT
BLADE BOVIE TIP EXT 4 (BLADE) ×4 IMPLANT
BLADE SURG 15 STRL LF DISP TIS (BLADE) ×4 IMPLANT
BLADE SURG 15 STRL SS (BLADE) ×4
ELECT CAUTERY BLADE TIP 2.5 (TIP) ×4
ELECT REM PT RETURN 9FT ADLT (ELECTROSURGICAL) ×4
ELECTRODE CAUTERY BLDE TIP 2.5 (TIP) ×4 IMPLANT
ELECTRODE REM PT RTRN 9FT ADLT (ELECTROSURGICAL) ×4 IMPLANT
GAUZE 4X4 16PLY ~~LOC~~+RFID DBL (SPONGE) ×4 IMPLANT
GLOVE PROTEXIS LATEX SZ 7.5 (GLOVE) ×8 IMPLANT
GLOVE SURG LATEX 7.5 PF (GLOVE) ×8 IMPLANT
GOWN STRL REUS W/ TWL LRG LVL3 (GOWN DISPOSABLE) ×8 IMPLANT
GOWN STRL REUS W/TWL LRG LVL3 (GOWN DISPOSABLE) ×8
KIT TURNOVER KIT A (KITS) ×4 IMPLANT
MANIFOLD NEPTUNE II (INSTRUMENTS) ×4 IMPLANT
NDL ANESTHESIA 27G X 3.5 (NEEDLE) ×4 IMPLANT
NDL HYPO 27GX1-1/4 (NEEDLE) ×4 IMPLANT
NEEDLE ANESTHESIA  27G X 3.5 (NEEDLE) ×4
NEEDLE ANESTHESIA 27G X 3.5 (NEEDLE) ×4 IMPLANT
NEEDLE HYPO 27GX1-1/4 (NEEDLE) ×4 IMPLANT
NS IRRIG 500ML POUR BTL (IV SOLUTION) ×4 IMPLANT
PACK BASIC III (MISCELLANEOUS) ×4
PACK HEAD/NECK (MISCELLANEOUS) ×4 IMPLANT
PACK SRG BSC III STRL LF (MISCELLANEOUS) ×4 IMPLANT
PATTIES SURGICAL .5 X3 (DISPOSABLE) ×4 IMPLANT
PATTIES SURGICAL .5X1.5 (GAUZE/BANDAGES/DRESSINGS) IMPLANT
PENCIL SMOKE EVACUATOR (MISCELLANEOUS) ×4 IMPLANT
SLEEVE SUCTION 125 (MISCELLANEOUS) ×4 IMPLANT
SPLINT NASAL REUTER .5MM BIVLV (MISCELLANEOUS) ×4 IMPLANT
SPONGE TONSIL 1 RF SGL (DISPOSABLE) ×4 IMPLANT
SUCTION COAG ELEC 10 HAND CTRL (ELECTROSURGICAL) ×4 IMPLANT
SUT CHROMIC 3-0 (SUTURE) ×4
SUT CHROMIC 3-0 KS 27XMFL CR (SUTURE) ×4
SUT ETHILON 3-0 KS 30 BLK (SUTURE) ×4 IMPLANT
SUT PLAIN GUT 4-0 (SUTURE) ×4 IMPLANT
SUT VIC AB 3-0 SH 27 (SUTURE) ×16
SUT VIC AB 3-0 SH 27X BRD (SUTURE) ×16 IMPLANT
SUTURE CHRMC 3-0 KS 27XMFL CR (SUTURE) ×4 IMPLANT
SYR 3ML LL SCALE MARK (SYRINGE) ×4 IMPLANT
TRAP FLUID SMOKE EVACUATOR (MISCELLANEOUS) ×4 IMPLANT
TUBING CONNECTING 10 (TUBING) ×4 IMPLANT

## 2021-12-12 NOTE — Anesthesia Preprocedure Evaluation (Signed)
Anesthesia Evaluation  Patient identified by MRN, date of birth, ID band Patient awake    Reviewed: Allergy & Precautions, H&P , NPO status , Patient's Chart, lab work & pertinent test results, reviewed documented beta blocker date and time   History of Anesthesia Complications Negative for: history of anesthetic complications  Airway Mallampati: II  TM Distance: >3 FB Neck ROM: full    Dental  (+) Caps, Dental Advidsory Given, Teeth Intact   Pulmonary neg shortness of breath, sleep apnea , neg COPD, neg recent URI,    Pulmonary exam normal breath sounds clear to auscultation       Cardiovascular Exercise Tolerance: Good negative cardio ROS Normal cardiovascular exam Rhythm:regular Rate:Normal     Neuro/Psych PSYCHIATRIC DISORDERS Anxiety Depression negative neurological ROS     GI/Hepatic Neg liver ROS, GERD  Medicated and Controlled,  Endo/Other  negative endocrine ROS  Renal/GU negative Renal ROS  negative genitourinary   Musculoskeletal   Abdominal   Peds  Hematology negative hematology ROS (+)   Anesthesia Other Findings Past Medical History: No date: Anxiety No date: Depression No date: GERD (gastroesophageal reflux disease) No date: Sleep apnea   Reproductive/Obstetrics negative OB ROS                             Anesthesia Physical Anesthesia Plan  ASA: 2  Anesthesia Plan: General   Post-op Pain Management:    Induction: Intravenous  PONV Risk Score and Plan: 2 and Ondansetron, Dexamethasone, Midazolam and Treatment may vary due to age or medical condition  Airway Management Planned: Oral ETT  Additional Equipment:   Intra-op Plan:   Post-operative Plan: Extubation in OR  Informed Consent: I have reviewed the patients History and Physical, chart, labs and discussed the procedure including the risks, benefits and alternatives for the proposed anesthesia with the  patient or authorized representative who has indicated his/her understanding and acceptance.     Dental Advisory Given  Plan Discussed with: Anesthesiologist, CRNA and Surgeon  Anesthesia Plan Comments:         Anesthesia Quick Evaluation

## 2021-12-12 NOTE — Progress Notes (Signed)
12/12/2021 6:27 PM  Richens, Belenda Cruise [id number]  Post-Op Check    Temp:  [97 F (36.1 C)-98.7 F (37.1 C)] 98.7 F (37.1 C) (10/18 1232) Pulse Rate:  [56-92] 88 (10/18 1813) Resp:  [5-20] 20 (10/18 1813) BP: (110-130)/(73-95) 113/78 (10/18 1232) SpO2:  [93 %-97 %] 97 % (10/18 1813) Weight:  [98.9 kg] 98.9 kg (10/18 0616),     Intake/Output Summary (Last 24 hours) at 12/12/2021 1827 Last data filed at 12/12/2021 1621 Gross per 24 hour  Intake 900 ml  Output 510 ml  Net 390 ml    No results found for this or any previous visit (from the past 24 hour(s)).  SUBJECTIVE: He is 8 hours postop tonsillectomy, UPPP, septoplasty, and turbinate reduction.  He is having some discomfort but is not intolerable and is able to swallow liquids.  OBJECTIVE: His vital signs are stable.  He has airflow through his nose even with splints but he is getting a little bit stuffy.  He is not spitting out a blood or have any evidence of tearing the stitches in the back of his throat.  He is able to get liquids down to good tonight  IMPRESSION: He is doing very well postop right now  PLAN: We will check him late morning to make sure he is taking plenty of liquids and and then we will plan to discharge him home.  He will rest at home with his head elevated.  We will start saline flushes tomorrow at home for his nose.  Huey Romans 12/12/2021, 6:27 PM

## 2021-12-12 NOTE — H&P (Signed)
H&P has been reviewed and patient reevaluated, no changes necessary. To be downloaded later.  

## 2021-12-12 NOTE — Op Note (Signed)
12/12/2021  9:51 AM  [id number]   Pre-Op Dx:  Deviated Nasal Septum, Hypertrophic Inferior Turbinates, obstructive sleep apnea with enlarged tonsils palate and uvula  Post-op Dx: Same  Proc: Nasal Septoplasty, Bilateral Partial Reduction Inferior Turbinates, uvulopalatopharyngoplasty, tonsillectomy  Surg:  Elon Alas Anna Beaird  Anes:  GOT  EBL: 100 mL  Comp: None  Findings: Very deviated septum with enlarged inferior turbinates.  Long palate with enlarged tonsils and enlarged uvula.  Procedure: With the patient in a comfortable supine position,  general orotracheal anesthesia was induced without difficulty.     The patient received preoperative Afrin spray for topical decongestion and vasoconstriction.  Intravenous prophylactic antibiotics were administered.  At an appropriate level, the patient was placed in a semi-sitting position.  Nasal vibrissae were trimmed.   1% Xylocaine with 1:100,000 epinephrine, 5 cc's, was infiltrated into the anterior floor of the nose, into the nasal spine region, into the membranous columella, and finally into the submucoperichondrial plane of the septum on both sides.  Several minutes were allowed for this to take effect.  Cottoniod pledgetts soaked in Afrin and 4% Xylocaine were placed into both nasal cavities and left while the patient was prepped and draped in the standard fashion.  The materials were removed from the nose and observed to be intact and correct in number.  The nose was inspected with a headlight and zero degree scope with the findings as described above.  A left Killian incision was sharply executed and carried down to the quadrangular cartilage. The mucoperichondrium was elelvated along the quadrangular plate back to the bony-cartilaginous junction. The mucoperiostium was then elevated along the ethmoid plate and the vomer. The boney-catilaginous junction was then split with a freer elevator and the mucoperiosteum was elevated on the  opposite side. The mucoperiosteum was then elevated along the maxillary crest as needed to expose the crooked bone of the crest.  Boney spurs of the vomer and maxillary crest were removed with Donavan Foil forceps.  I needed to use the chisel to fracture a bony spur of the maxillary crest on the right side.  The cartilaginous plate was trimmed along its posterior and inferior borders of about 2 mm of cartilage to free it up inferiorly. Some of the deviated ethmoid plate was then fractured and removed with Takahashi forceps to free up the posterior border of the quadrangular plate and allow it to swing back to the midline. The mucosal flaps were placed back into their anatomic position to allow visualization of the airways. The septum now sat in the midline with an improved airway.  A 3-0 Chromic suture on a Keith needle in used to anchor the inferior septum at the nasal spine with a through and through suture. The mucosal flaps are then sutured together using a through and through whip stitch of 4-0 Plain Gut with a mini-Keith needle. This was used to close the Stagecoach incision as well.   The inferior turbinates were then inspected. An incision was created along the inferior aspect of the left inferior turbinate with removal of some of the inferior soft tissue and bone. Electrocautery was used to control bleeding in the area. The remaining turbinate was then outfractured to open up the airway further. There was no significant bleeding noted. The right turbinate was then trimmed and outfractured in a similar fashion.  The airways were then visualized and showed open passageways on both sides that were significantly improved compared to before surgery. There was no signifcant bleeding. Nasal splints were applied  to both sides of the septum using Xomed 0.97mm regular sized splints that were trimmed, and then held in position with a 3-0 Nylon through and through suture.  The patient then had his neck extended some  and and placed in a slight Trendelenburg position.  A Dingman mouth blade was used to visualize the oropharynx with a #4 blade to hold the tongue and oral tracheal tube in position.  The posterior pharynx look clear but there was not much room here because the enlarged tonsils, the long soft palate, and the very large uvula.  The left tonsil was removed first.  It was grasped and pulled medially and the anterior pillar was incised using electrocautery.  The tonsil was dissected from its fossa using sharp and blunt dissection.  It was densely adherent to the underlying muscle and was freed from that.  Once the tonsil was removed bleeding was controlled with direct pressure and electrocautery.  The right tonsil then was removed in a similar fashion and bleeding was controlled.  The UPPP was then done by extending the palate to create some tension and then on the posterior tonsillar pillar making a vertical incision along the left side of the uvula.  This extended up approximately 5 to 6 mm.  The posterior tonsillar pillar was then rotated anteriorly and laterally and was sutured to the anterior tonsillar pillar to close the tonsillar fossa on the left side and increased the oropharyngeal space.  This was sutured using 3-0 Vicryl sutures in a mattress suture.  This was repeated on the right side with incising along the right side of the uvula and advancing the posterior tonsillar pillar forward and laterally and suturing it to the anterior tonsillar pillar.  3-0 Vicryl's were used again interrupted to close the space.  The uvula was then trimmed of about one half of its length.  The remaining uvula had the nasopharyngeal mucosa sutured to the oropharyngeal mucosa to help cover over the uvular muscle.  This created a much larger oral pharyngeal space and more room into the nasopharynx.  There is no bleeding noted.  The patient tolerated the procedure well.  There were no operative complications.  A small  nasogastric tube was placed into his stomach through the mouth and a small amount of clear liquid was suctioned out.  The patient was turned back over to anesthesia, and awakened, extubated, and taken to the PACU in satisfactory condition.  Dispo:   PACU to MedSurg floor to be observed overnight to make sure he is taking orals well  Plan: Ice, elevation, narcotic analgesia, steroid taper, and prophylactic antibiotics for the duration of indwelling nasal foreign bodies.  We will reevaluate the patient in the office in 6 days and remove the septal splints.  Return to work in 10 days, strenuous activities in two weeks.   Beverly Sessions Keelin Neville 12/12/2021 9:51 AM

## 2021-12-12 NOTE — Transfer of Care (Signed)
Immediate Anesthesia Transfer of Care Note  Patient: Jerry Snyder  Procedure(s) Performed: SEPTOPLASTY (Nose) TURBINATE REDUCTION/SUBMUCOSAL RESECTION (Bilateral: Nose) TONSILLECTOMY (Bilateral: Mouth) UVULOPALATOPHARYNGOPLASTY (UPPP)  Patient Location: PACU  Anesthesia Type:General  Level of Consciousness: drowsy  Airway & Oxygen Therapy: Patient Spontanous Breathing and Patient connected to face mask oxygen  Post-op Assessment: Report given to RN and Post -op Vital signs reviewed and stable  Post vital signs: Reviewed and stable  Last Vitals:  Vitals Value Taken Time  BP 110/73 12/12/21 0948  Temp    Pulse 68 12/12/21 0950  Resp 19 12/12/21 0950  SpO2 95 % 12/12/21 0950  Vitals shown include unvalidated device data.  Last Pain:  Vitals:   12/12/21 0616  TempSrc: Temporal  PainSc: 0-No pain         Complications: No notable events documented.

## 2021-12-12 NOTE — Anesthesia Procedure Notes (Signed)
Procedure Name: Intubation Date/Time: 12/12/2021 7:39 AM  Performed by: Cammie Sickle, CRNAPre-anesthesia Checklist: Patient identified, Emergency Drugs available, Suction available and Patient being monitored Patient Re-evaluated:Patient Re-evaluated prior to induction Oxygen Delivery Method: Circle system utilized Preoxygenation: Pre-oxygenation with 100% oxygen Induction Type: IV induction Ventilation: Mask ventilation without difficulty Laryngoscope Size: McGraph and 4 Grade View: Grade I Tube type: Oral Rae Tube size: 7.0 mm Number of attempts: 1 Airway Equipment and Method: Stylet Placement Confirmation: ETT inserted through vocal cords under direct vision, positive ETCO2 and breath sounds checked- equal and bilateral Secured at: 21 cm Tube secured with: Tape Dental Injury: Teeth and Oropharynx as per pre-operative assessment  Comments: Inserted by Sunday Shams, SRNA

## 2021-12-13 ENCOUNTER — Encounter: Payer: Self-pay | Admitting: Otolaryngology

## 2021-12-13 DIAGNOSIS — J343 Hypertrophy of nasal turbinates: Secondary | ICD-10-CM | POA: Diagnosis not present

## 2021-12-13 LAB — SURGICAL PATHOLOGY

## 2021-12-13 MED ORDER — HYDROCODONE-ACETAMINOPHEN 7.5-325 MG/15ML PO SOLN
ORAL | Status: AC
  Filled 2021-12-13: qty 15

## 2021-12-13 MED ORDER — CEPHALEXIN 500 MG PO CAPS: 500.0000 mg | ORAL_CAPSULE | Freq: Two times a day (BID) | ORAL | 0 refills | Status: DC

## 2021-12-13 MED ORDER — PREDNISONE 10 MG PO TABS: ORAL_TABLET | ORAL | 0 refills | Status: DC

## 2021-12-13 MED ORDER — HYDROCODONE-ACETAMINOPHEN 7.5-325 MG/15ML PO SOLN: 15.0000 mL | ORAL | 0 refills | Status: AC | PRN

## 2021-12-13 NOTE — Discharge Summary (Signed)
Jerry Snyder, [name] Nov 18, 1965 Jerry Murders, MD  Discharge summary  HPI: Patient was admitted yesterday for septoplasty and trimming of the inferior turbinates to open up his nasal airway as well as tonsillectomy and UPPP for opening up his oropharynx.  He has had a history of obstructive sleep apnea but unable to tolerate CPAP.  The surgery was to help open his airways to improve this during sleep.  The patient tolerated the surgery well and is having only mild to moderate pain currently.  He is able to swallow using liquids and soft foods.  His pain is managed with oral meds.  He is breathing some through his nose and will start saline flushes at home.  He is discharged home to be followed up in the office in 5 more days.  Allergies: No Known Allergies  ROS: Review of systems normal other than 12 systems except per HPI.  PMH:  Past Medical History:  Diagnosis Date   Anxiety    Depression    GERD (gastroesophageal reflux disease)    Sleep apnea     FH: History reviewed. No pertinent family history.  SH:  Social History   Socioeconomic History   Marital status: Married    Spouse name: Not on file   Number of children: Not on file   Years of education: Not on file   Highest education level: Not on file  Occupational History   Not on file  Tobacco Use   Smoking status: Never   Smokeless tobacco: Never  Vaping Use   Vaping Use: Never used  Substance and Sexual Activity   Alcohol use: Not Currently    Comment: occ   Drug use: Never   Sexual activity: Not on file  Other Topics Concern   Not on file  Social History Narrative   Not on file   Social Determinants of Health   Financial Resource Strain: Not on file  Food Insecurity: No Food Insecurity (12/12/2021)   Hunger Vital Sign    Worried About Running Out of Food in the Last Year: Never true    Ran Out of Food in the Last Year: Never true  Transportation Needs: No Transportation Needs (12/12/2021)    PRAPARE - Administrator, Civil Service (Medical): No    Lack of Transportation (Non-Medical): No  Physical Activity: Not on file  Stress: Not on file  Social Connections: Not on file  Intimate Partner Violence: Not At Risk (12/12/2021)   Humiliation, Afraid, Rape, and Kick questionnaire    Fear of Current or Ex-Partner: No    Emotionally Abused: No    Physically Abused: No    Sexually Abused: No    PSH:  Past Surgical History:  Procedure Laterality Date   foreign object removal     leg   NASAL TURBINATE REDUCTION Bilateral 12/12/2021   Procedure: TURBINATE REDUCTION/SUBMUCOSAL RESECTION;  Surgeon: Jerry Murders, MD;  Location: [facility];  Service: ENT;  Laterality: Bilateral;   SEPTOPLASTY N/A 12/12/2021   Procedure: SEPTOPLASTY;  Surgeon: Jerry Murders, MD;  Location: [facility] OR[facility];  Service: ENT;  Laterality: N/A;   SHOULDER SURGERY Right    TONSILLECTOMY Bilateral 12/12/2021   Procedure: TONSILLECTOMY;  Surgeon: Jerry Murders, MD;  Location: [facility];  Service: ENT;  Laterality: Bilateral;   UVULOPALATOPHARYNGOPLASTY N/A 12/12/2021   Procedure: UVULOPALATOPHARYNGOPLASTY (UPPP);  Surgeon: Jerry Murders, MD;  Location: [facility];  Service: ENT;  Laterality: N/A;    Physical  Exam: His nose has nasal splints in both  sides that are in position.  He has some dried mucus that he will flush out.  His oropharynx shows his trimmed uvula to have no scabbing or crusts.  His lateral walls for the stitches intact that are holding his UPPP.  He is managing his secretions very well.  His voice is clear.  His lungs are clear.   A/P: He is 1 day postop septoplasty, turbinate reduction, tonsillectomy, and UPPP.  He is doing well postop.  He will flush his nose multiple times a day at home and rest at home with his head elevated.  We will slowly increase his diet from liquids to soft foods to regular as tolerated.  He will be seen back in follow-up in 5 days but will call and let me know if he  needs to be seen sooner.  He has prescriptions for Tylenol with hydrocodone liquid to use for severe pain or regular Tylenol for minor pain.  He will be on a short prednisone taper for 6 days and some Keflex as a preventative antibiotic.   Jerry Snyder 12/13/2021 11:18 AM

## 2021-12-13 NOTE — Progress Notes (Signed)
Nsg Discharge Note  Admit Date:  12/12/2021 Discharge date: 12/13/2021   Horton Finer to be D/C'd Home per MD order.  AVS completed.  Patient/caregiver able to verbalize understanding.  Discharge Medication: Allergies as of 12/13/2021   No Known Allergies      Medication List     TAKE these medications    aspirin EC 81 MG tablet Take 81 mg by mouth daily.   atorvastatin 20 MG tablet Commonly known as: LIPITOR Take 20 mg by mouth at bedtime.   cephALEXin 500 MG capsule Commonly known as: Keflex Take 1 capsule (500 mg total) by mouth 2 (two) times daily.   cetirizine 10 MG tablet Commonly known as: ZYRTEC Take 10 mg by mouth every other day. At bedtime   cyclobenzaprine 10 MG tablet Commonly known as: FLEXERIL Take 1/2-1 tablet at bedtime as needed.   escitalopram 20 MG tablet Commonly known as: LEXAPRO Take 20 mg by mouth daily.   HYDROcodone-acetaminophen 7.5-325 mg/15 ml solution Commonly known as: HYCET Take 15 mLs by mouth every 4 (four) hours as needed for up to 5 days for moderate pain (Not to take more than 5 doses in 24 hours).   loratadine 10 MG tablet Commonly known as: CLARITIN Take 10 mg by mouth every other day. At bedtime   metroNIDAZOLE 1 % gel Commonly known as: METROGEL Apply 1 Application topically daily as needed.   Multi-Vitamins Tabs Take by mouth.   omeprazole 20 MG capsule Commonly known as: PRILOSEC Take 20 mg by mouth daily.   predniSONE 10 MG tablet Commonly known as: DELTASONE Start with 3 pills tomorrow. Taper over the next 6 days.  3,3,2,2,1,1.   vitamin C 1000 MG tablet Take 1,000 mg by mouth daily.   Vitamin D3 25 MCG (1000 UT) Caps Take 1 capsule by mouth daily.        Discharge Assessment: Vitals:   12/13/21 0538 12/13/21 1136  BP: 128/89 (!) 136/92  Pulse: 67 68  Resp: 16 17  Temp: 97.8 F (36.6 C) 98.4 F (36.9 C)  SpO2: 99% 97%   Skin clean, dry and intact without evidence of skin break down,  no evidence of skin tears noted. IV catheter discontinued intact. Site without signs and symptoms of complications - no redness or edema noted at insertion site, patient denies c/o pain - only slight tenderness at site.  Dressing with slight pressure applied.  D/c Instructions-Education: Discharge instructions given to patient/family with verbalized understanding. D/c education completed with patient/family including follow up instructions, medication list, d/c activities limitations if indicated, with other d/c instructions as indicated by MD - patient able to verbalize understanding, all questions fully answered. Patient instructed to return to ED, call 911, or call MD for any changes in condition.  Patient escorted via [place], and D/C home via private auto.  Tresa Endo, RN 12/13/2021 11:39 AM

## 2021-12-17 NOTE — Anesthesia Postprocedure Evaluation (Signed)
Anesthesia Post Note  Patient: Daxen Lanum  Procedure(s) Performed: SEPTOPLASTY (Nose) TURBINATE REDUCTION/SUBMUCOSAL RESECTION (Bilateral: Nose) TONSILLECTOMY (Bilateral: Mouth) UVULOPALATOPHARYNGOPLASTY (UPPP)  Patient location during evaluation: PACU Anesthesia Type: General Level of consciousness: awake and alert Pain management: pain level controlled Vital Signs Assessment: post-procedure vital signs reviewed and stable Respiratory status: spontaneous breathing, nonlabored ventilation, respiratory function stable and patient connected to nasal cannula oxygen Cardiovascular status: blood pressure returned to baseline and stable Postop Assessment: no apparent nausea or vomiting Anesthetic complications: no   No notable events documented.   Last Vitals:  Vitals:   12/13/21 0538 12/13/21 1136  BP: 128/89 (!) 136/92  Pulse: 67 68  Resp: 16 17  Temp: 36.6 C 36.9 C  SpO2: 99% 97%    Last Pain:  Vitals:   12/13/21 1136  TempSrc:   PainSc: 0-No pain                 Martha Clan

## 2021-12-19 ENCOUNTER — Encounter: Payer: Self-pay | Admitting: Otolaryngology

## 2022-05-21 ENCOUNTER — Encounter: Payer: Self-pay | Admitting: Otolaryngology

## 2022-05-30 ENCOUNTER — Ambulatory Visit
Admission: RE | Admit: 2022-05-30 | Discharge: 2022-05-30 | Disposition: A | Discharge status: Home / Self Care | Payer: 59 | Attending: Otolaryngology | Admitting: Otolaryngology

## 2022-05-30 ENCOUNTER — Ambulatory Visit: Payer: 59 | Admitting: Anesthesiology

## 2022-05-30 ENCOUNTER — Encounter
Admission: RE | Disposition: A | Discharge status: Home / Self Care | Payer: Self-pay | Source: Home / Self Care | Attending: Otolaryngology

## 2022-05-30 ENCOUNTER — Encounter: Payer: Self-pay | Admitting: Otolaryngology

## 2022-05-30 ENCOUNTER — Other Ambulatory Visit: Payer: Self-pay

## 2022-05-30 DIAGNOSIS — E669 Obesity, unspecified: Secondary | ICD-10-CM | POA: Diagnosis not present

## 2022-05-30 DIAGNOSIS — J3489 Other specified disorders of nose and nasal sinuses: Secondary | ICD-10-CM | POA: Diagnosis present

## 2022-05-30 DIAGNOSIS — Z6830 Body mass index (BMI) 30.0-30.9, adult: Secondary | ICD-10-CM | POA: Insufficient documentation

## 2022-05-30 DIAGNOSIS — G4730 Sleep apnea, unspecified: Secondary | ICD-10-CM | POA: Insufficient documentation

## 2022-05-30 DIAGNOSIS — F32A Depression, unspecified: Secondary | ICD-10-CM | POA: Diagnosis not present

## 2022-05-30 DIAGNOSIS — K219 Gastro-esophageal reflux disease without esophagitis: Secondary | ICD-10-CM | POA: Diagnosis not present

## 2022-05-30 DIAGNOSIS — F419 Anxiety disorder, unspecified: Secondary | ICD-10-CM | POA: Diagnosis not present

## 2022-05-30 HISTORY — PX: SEPTOPLASTY: SHX2393

## 2022-05-30 SURGERY — SEPTOPLASTY, NOSE
Anesthesia: General | Site: Nose

## 2022-05-30 MED ORDER — DEXTROSE 5 % IV SOLN
2000.0000 mg | Freq: Once | INTRAVENOUS | Status: AC
  Administered 2022-05-30: 2000 mg via INTRAVENOUS

## 2022-05-30 MED ORDER — PHENYLEPHRINE HCL 0.5 % NA SOLN
NASAL | Status: DC | PRN
  Administered 2022-05-30: 15 mL via TOPICAL

## 2022-05-30 MED ORDER — CEPHALEXIN 500 MG PO CAPS: 500.0000 mg | ORAL_CAPSULE | Freq: Two times a day (BID) | ORAL | 0 refills | Status: DC

## 2022-05-30 MED ORDER — OXYMETAZOLINE HCL 0.05 % NA SOLN
2.0000 | NASAL | Status: AC
  Administered 2022-05-30: 2 via NASAL

## 2022-05-30 MED ORDER — PROPOFOL 10 MG/ML IV BOLUS
INTRAVENOUS | Status: DC | PRN
  Administered 2022-05-30: 200 mg via INTRAVENOUS
  Administered 2022-05-30: 100 mg via INTRAVENOUS

## 2022-05-30 MED ORDER — SUGAMMADEX SODIUM 200 MG/2ML IV SOLN
INTRAVENOUS | Status: DC | PRN
  Administered 2022-05-30: 200 mg via INTRAVENOUS

## 2022-05-30 MED ORDER — EPHEDRINE SULFATE (PRESSORS) 50 MG/ML IJ SOLN
INTRAMUSCULAR | Status: DC | PRN
  Administered 2022-05-30: 10 mg via INTRAVENOUS
  Administered 2022-05-30: 5 mg via INTRAVENOUS
  Administered 2022-05-30: 10 mg via INTRAVENOUS
  Administered 2022-05-30: 5 mg via INTRAVENOUS

## 2022-05-30 MED ORDER — DEXMEDETOMIDINE HCL IN NACL 80 MCG/20ML IV SOLN
INTRAVENOUS | Status: DC | PRN
  Administered 2022-05-30: 10 ug via BUCCAL

## 2022-05-30 MED ORDER — LACTATED RINGERS IV SOLN: INTRAVENOUS | Status: DC

## 2022-05-30 MED ORDER — FENTANYL CITRATE (PF) 100 MCG/2ML IJ SOLN
INTRAMUSCULAR | Status: DC | PRN
  Administered 2022-05-30: 100 ug via INTRAVENOUS

## 2022-05-30 MED ORDER — ACETAMINOPHEN 10 MG/ML IV SOLN
1000.0000 mg | Freq: Once | INTRAVENOUS | Status: AC
  Administered 2022-05-30: 1000 mg via INTRAVENOUS

## 2022-05-30 MED ORDER — LIDOCAINE HCL (CARDIAC) PF 100 MG/5ML IV SOSY
PREFILLED_SYRINGE | INTRAVENOUS | Status: DC | PRN
  Administered 2022-05-30: 100 mg via INTRAVENOUS

## 2022-05-30 MED ORDER — ONDANSETRON HCL 4 MG/2ML IJ SOLN
INTRAMUSCULAR | Status: DC | PRN
  Administered 2022-05-30: 4 mg via INTRAVENOUS

## 2022-05-30 MED ORDER — MIDAZOLAM HCL 5 MG/5ML IJ SOLN
INTRAMUSCULAR | Status: DC | PRN
  Administered 2022-05-30: 2 mg via INTRAVENOUS

## 2022-05-30 MED ORDER — LIDOCAINE-EPINEPHRINE 1 %-1:100000 IJ SOLN
INTRAMUSCULAR | Status: DC | PRN
  Administered 2022-05-30: 6 mL
  Administered 2022-05-30: 3 mL

## 2022-05-30 MED ORDER — HYDROCODONE-ACETAMINOPHEN 5-325 MG PO TABS: 1.0000 | ORAL_TABLET | Freq: Four times a day (QID) | ORAL | 0 refills | Status: AC | PRN

## 2022-05-30 MED ORDER — DEXAMETHASONE SODIUM PHOSPHATE 4 MG/ML IJ SOLN
INTRAMUSCULAR | Status: DC | PRN
  Administered 2022-05-30: 10 mg via INTRAVENOUS

## 2022-05-30 MED ORDER — ROCURONIUM BROMIDE 100 MG/10ML IV SOLN
INTRAVENOUS | Status: DC | PRN
  Administered 2022-05-30: 50 mg via INTRAVENOUS

## 2022-05-30 MED ORDER — PREDNISONE 10 MG PO TABS: ORAL_TABLET | ORAL | 0 refills | Status: DC

## 2022-05-30 SURGICAL SUPPLY — 34 items
BLADE SURG 15 STRL LF DISP TIS (BLADE) IMPLANT
BLADE SURG 15 STRL SS (BLADE) ×1
BNDG ADH 2 X3.75 FABRIC TAN LF (GAUZE/BANDAGES/DRESSINGS) IMPLANT
BNDG ADH XL 3.75X2 STRCH LF (GAUZE/BANDAGES/DRESSINGS) ×1
CANISTER SUCT 1200ML W/VALVE (MISCELLANEOUS) ×1 IMPLANT
COAGULATOR SUCT 8FR VV (MISCELLANEOUS) ×1 IMPLANT
COVER LIGHT HANDLE UNIVERSAL (MISCELLANEOUS) IMPLANT
DRSG EMULSION OIL 3X3 NADH (GAUZE/BANDAGES/DRESSINGS) IMPLANT
ELECT REM PT RETURN 9FT ADLT (ELECTROSURGICAL) ×1
ELECTRODE REM PT RTRN 9FT ADLT (ELECTROSURGICAL) ×1 IMPLANT
GAUZE SPONGE 4X4 12PLY STRL (GAUZE/BANDAGES/DRESSINGS) IMPLANT
GLOVE SURG GAMMEX PI TX LF 7.5 (GLOVE) ×2 IMPLANT
GOWN STRL REUS W/ TWL LRG LVL3 (GOWN DISPOSABLE) ×1 IMPLANT
GOWN STRL REUS W/TWL LRG LVL3 (GOWN DISPOSABLE) ×1
KIT TURNOVER KIT A (KITS) ×1 IMPLANT
NDL ANESTHESIA 27G X 3.5 (NEEDLE) ×1 IMPLANT
NDL HYPO 27GX1-1/4 (NEEDLE) ×1 IMPLANT
NEEDLE ANESTHESIA  27G X 3.5 (NEEDLE) ×1
NEEDLE ANESTHESIA 27G X 3.5 (NEEDLE) ×1 IMPLANT
NEEDLE HYPO 27GX1-1/4 (NEEDLE) ×1 IMPLANT
PACK ENT CUSTOM (PACKS) ×1 IMPLANT
PATTIES SURGICAL .5 X3 (DISPOSABLE) ×1 IMPLANT
SOL PREP PVP 2OZ (MISCELLANEOUS) ×1
SOLUTION PREP PVP 2OZ (MISCELLANEOUS) IMPLANT
SPLINT NASAL SEPTAL BLV .50 ST (MISCELLANEOUS) ×1 IMPLANT
STRAP BODY AND KNEE 60X3 (MISCELLANEOUS) ×1 IMPLANT
SUT CHROMIC 3-0 (SUTURE) ×1
SUT CHROMIC 3-0 KS 27XMFL CR (SUTURE) ×1
SUT ETHILON 3-0 KS 30 BLK (SUTURE) ×1 IMPLANT
SUT PLAIN GUT 4-0 (SUTURE) ×1 IMPLANT
SUTURE CHRMC 3-0 KS 27XMFL CR (SUTURE) IMPLANT
SYR 3ML LL SCALE MARK (SYRINGE) ×1 IMPLANT
TOWEL OR 17X26 4PK STRL BLUE (TOWEL DISPOSABLE) ×1 IMPLANT
WATER STERILE IRR 250ML POUR (IV SOLUTION) ×1 IMPLANT

## 2022-05-30 NOTE — H&P (Signed)
H&P has been reviewed and patient reevaluated, no changes necessary. To be downloaded later.  

## 2022-05-30 NOTE — Anesthesia Postprocedure Evaluation (Signed)
Anesthesia Post Note  Patient: Jerry Snyder  Procedure(s) Performed: REPAIR SEPTAL PERFORATION WITH CARTILAGINOUS AUTOGRAFT (Nose)  Patient location during evaluation: PACU Anesthesia Type: General Level of consciousness: awake and alert Pain management: pain level controlled Vital Signs Assessment: post-procedure vital signs reviewed and stable Respiratory status: spontaneous breathing, nonlabored ventilation, respiratory function stable and patient connected to nasal cannula oxygen Cardiovascular status: blood pressure returned to baseline and stable Postop Assessment: no apparent nausea or vomiting Anesthetic complications: no   No notable events documented.   Last Vitals:  Vitals:   05/30/22 0935 05/30/22 0938  BP:  115/74  Pulse: 88 83  Resp: 13 15  Temp:  (!) 36.3 C  SpO2: 100% 94%    Last Pain:  Vitals:   05/30/22 0938  TempSrc:   PainSc: 0-No pain                 Helayne Seminole

## 2022-05-30 NOTE — Op Note (Signed)
05/30/2022  9:08 AM    Baeten, Belenda Cruise  [id number]   Pre-Op Dx: Septal perforation  Post-op Dx: Septal perforation  Proc: Septal perforation repair using autograft.   Harvesting autograft from left auricle  Surg:  Elon Alas Jonpaul Lumm  Anes:  GOT  EBL: 30 mL  Comp: None  Findings: Autograft was taken from the conchal bowl of the left auricular cartilage.  An island of skin and perichondrium was left in the middle of a cartilage graft.  This was marked and removed to fit the perforation hole.  Procedure: The patient was brought to the operating room and placed in a supine position.  His nose was prepped using 6 mL of 1% Xylocaine with epi 1:100,000 for infiltration of the nasal septum.  Cottonoid pledgets soaked in phenylephrine and lidocaine were then placed into the nose and both sides as well.  3 mL of 1% Xylocaine with epi 1: 100,000 was used for infiltration in the left auricle postauricularly as well and the conchal bowl.  The patient's left auricle was then prepped and draped in sterile fashion and then he had draping placed over his face in a normal position for a septoplasty.  The cottonoid pledgets were removed and a 0 degree scope was used to visualize both sides.  Septum was quite straight although he had the perforation anteriorly that was about 6  mm in width and 1 cm in height.  A left hemitransfixion incision was created with elevation of the mucoperichondrium on the left side of the quadrangular plate.  This was elevated all around the perforation to free up flap on the left side.  The perichondrium was not elevated on the right side of the nose.  Once the flap was elevated on this side it was left there and I then worked on harvesting the graft.  The left auricle was opened up and had previously been prepped and draped.  The graft size was marked in the bottom of the conchal bowl and using a scalpel incision was made through the skin down to the cartilage but not through  the cartilage all the way around the skin graft area.  The mucoperichondrium was then elevated on all sides of the skin graft anteriorly to free this up.  A double hook was then used to pull back the skin away from the graft and an incision was made in the cartilage about 3 mm wider than the skin graft area.  This was incised all the way around the skin graft.  The graft cartilage was then elevated up and was freed from the deep tissues leaving most of the perichondrium intact on the cartilage on its deep side.  Once this was removed then the skin flaps were placed back in their anatomic position.  There is 1 small bleeding site at the base of the donor area.  This was then covered with Xeroform to help hold it down so that it could heal by secondary intention.  Gauze dressing was placed over it followed by a Band-Aid.  The graft was then brought into the field and was fairly thick at 1 end of it.  A scalpel was used to thin the graft slightly at the thicker and so that it was more smooth when it would be sitting in the nose.  The nose then visualized in the left perichondrial flap was elevated over the cartilage.  The graft was then placed in here with the skin facing the right nostril and placing that in  the hole.  The flap was then placed back over the cartilage on the left side to hold it in place and this closed the hole on the right side completely with the skin graft in the left side had perichondrium that was visible on the cartilage side covered over by the flap.  A 4-0 plain gut suture was used to tack the flap down to the septum.  Multiple sutures were used anteriorly, inferiorly, superiorly, and a couple posteriorly to hold it tight to the septum.  The right side showed the skin and the whole and filling it completely with no evidence of perforation.  The left side showed just perichondrium in the previous perforation hole with the skin attached around.  3-0 chromic through and through sutures were  then used to close the hemitransfixion incision and hold the remaining portion of the septal flap in position.  His septum was lying in the midline and the graft was smooth on both sides and vertical to create good airways on both sides.  Xomed 0.5 mm nasal splints were then trimmed and placed on both sides of the septum to cover over the graft area.  A through and through 3-0 nylon suture was then used to hold the splints in position but not too tightly to allow good blood flow to the graft.  The 0 degree scope was used to revisualize the areas and the airway was good on both sides and the graft was completely covered bilaterally and was filling the whole bilaterally.  The patient was awakened and taken to the recovery room in satisfactory condition.  There were no operative complications.  Dispo: To PACU to be discharged home  Plan: To follow-up in the office in 1 week to reevaluate the nose and decide when to remove the nasal splints.  He will have antibiotics and a short prednisone taper for helping with wound healing.  He is given some Norco 5/325 that he can use for pain as needed.  He will call if he has any problems  Huey Romans  05/30/2022 9:08 AM

## 2022-05-30 NOTE — Anesthesia Procedure Notes (Signed)
Procedure Name: Intubation Date/Time: 05/30/2022 7:40 AM  Performed by: [place] Liz, [place], CRNAPre-anesthesia Checklist: Patient identified, Patient being monitored, Timeout performed, Emergency Drugs available and Suction available Patient Re-evaluated:Patient Re-evaluated prior to induction Oxygen Delivery Method: Circle system utilized Preoxygenation: Pre-oxygenation with 100% oxygen Induction Type: IV induction Ventilation: Mask ventilation without difficulty Laryngoscope Size: McGraph and 4 Grade View: Grade I Tube type: Oral Rae Tube size: 8.0 mm Number of attempts: 1 Airway Equipment and Method: Stylet Placement Confirmation: ETT inserted through vocal cords under direct vision, positive ETCO2 and breath sounds checked- equal and bilateral Secured at: 23 cm Tube secured with: Tape Dental Injury: Teeth and Oropharynx as per pre-operative assessment  Comments: CRNA 1st attempt with epiglottis covering glottic opening. MDA with better view, full view of cords, Oral rae ett inserted with ease

## 2022-05-30 NOTE — Transfer of Care (Signed)
Immediate Anesthesia Transfer of Care Note  Patient: Jerry Snyder  Procedure(s) Performed: REPAIR SEPTAL PERFORATION WITH CARTILAGINOUS AUTOGRAFT (Nose)  Patient Location: PACU  Anesthesia Type: General  Level of Consciousness: awake, alert  and patient cooperative  Airway and Oxygen Therapy: Patient Spontanous Breathing and Patient connected to supplemental oxygen  Post-op Assessment: Post-op Vital signs reviewed, Patient's Cardiovascular Status Stable, Respiratory Function Stable, Patent Airway and No signs of Nausea or vomiting  Post-op Vital Signs: Reviewed and stable  Complications: No notable events documented.

## 2022-05-30 NOTE — Anesthesia Preprocedure Evaluation (Addendum)
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, plan videolaryngoscope for intubation   breath sounds clear to auscultation       Cardiovascular Exercise Tolerance: Good negative cardio ROS  Rhythm:Regular Rate:Normal     Neuro/Psych  PSYCHIATRIC DISORDERS Anxiety Depression    negative neurological ROS     GI/Hepatic Neg liver ROS,GERD  Controlled and Medicated,,  Endo/Other  negative endocrine ROS    Renal/GU negative Renal ROS     Musculoskeletal   Abdominal  (+) + obese  Peds  Hematology negative hematology ROS (+)   Anesthesia Other Findings Sleep apnea  GERD (gastroesophageal reflux disease) Anxiety  Depression  Hx 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   Reproductive/Obstetrics                             Anesthesia Physical Anesthesia Plan  ASA: 2  Anesthesia Plan: General   Post-op Pain Management:    Induction: Intravenous  PONV Risk Score and Plan: 2 and Treatment may vary due to age or medical condition  Airway Management Planned: Oral ETT and Video Laryngoscope Planned  Additional Equipment:   Intra-op Plan:   Post-operative Plan:   Informed Consent: I have reviewed the patients History and Physical, chart, labs and discussed the procedure including the risks, benefits and alternatives for the proposed anesthesia with the patient or authorized representative who has indicated his/her understanding and acceptance.       Plan Discussed with: CRNA  Anesthesia Plan Comments: (Note hx UP3 surgery, need for  videolaryngoscope)       Anesthesia Quick Evaluation

## 2022-05-31 ENCOUNTER — Encounter: Payer: Self-pay | Admitting: Otolaryngology

## 2022-06-09 IMAGING — DX DG CERVICAL SPINE 2 OR 3 VIEWS
3 series · 3 of 3 positions shown · non-contrast
Comparison: No recent.

CLINICAL DATA: Cervical radiculopathy.  Right shoulder pain.

EXAM:
CERVICAL SPINE - 2-3 VIEW

[dg cervical spine 2 or 3 views (1 of 3)]
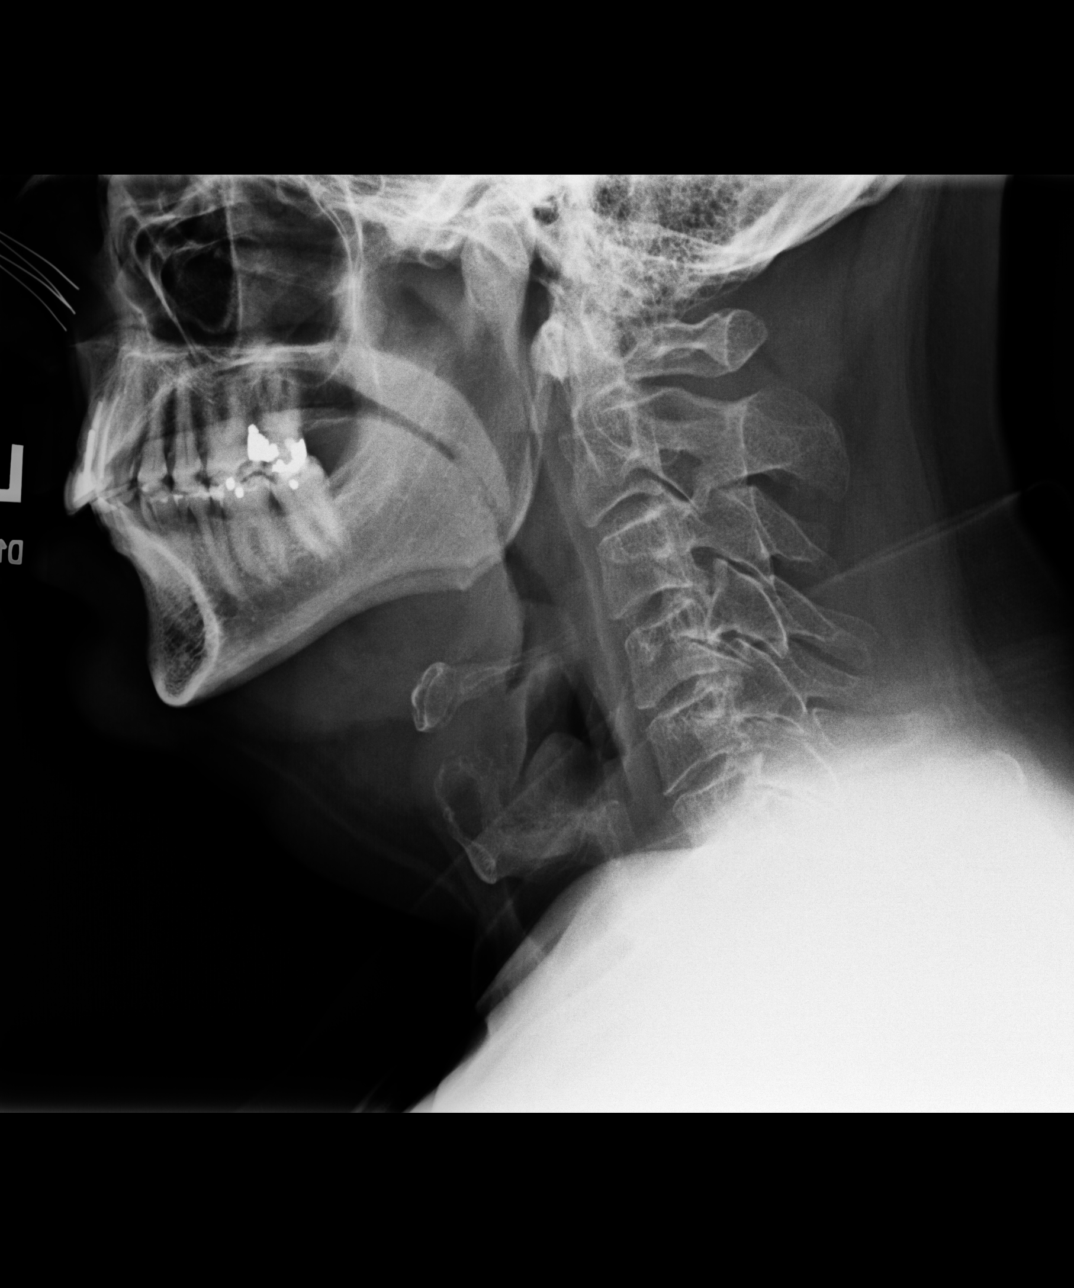

[dg cervical spine 2 or 3 views (2 of 3)]
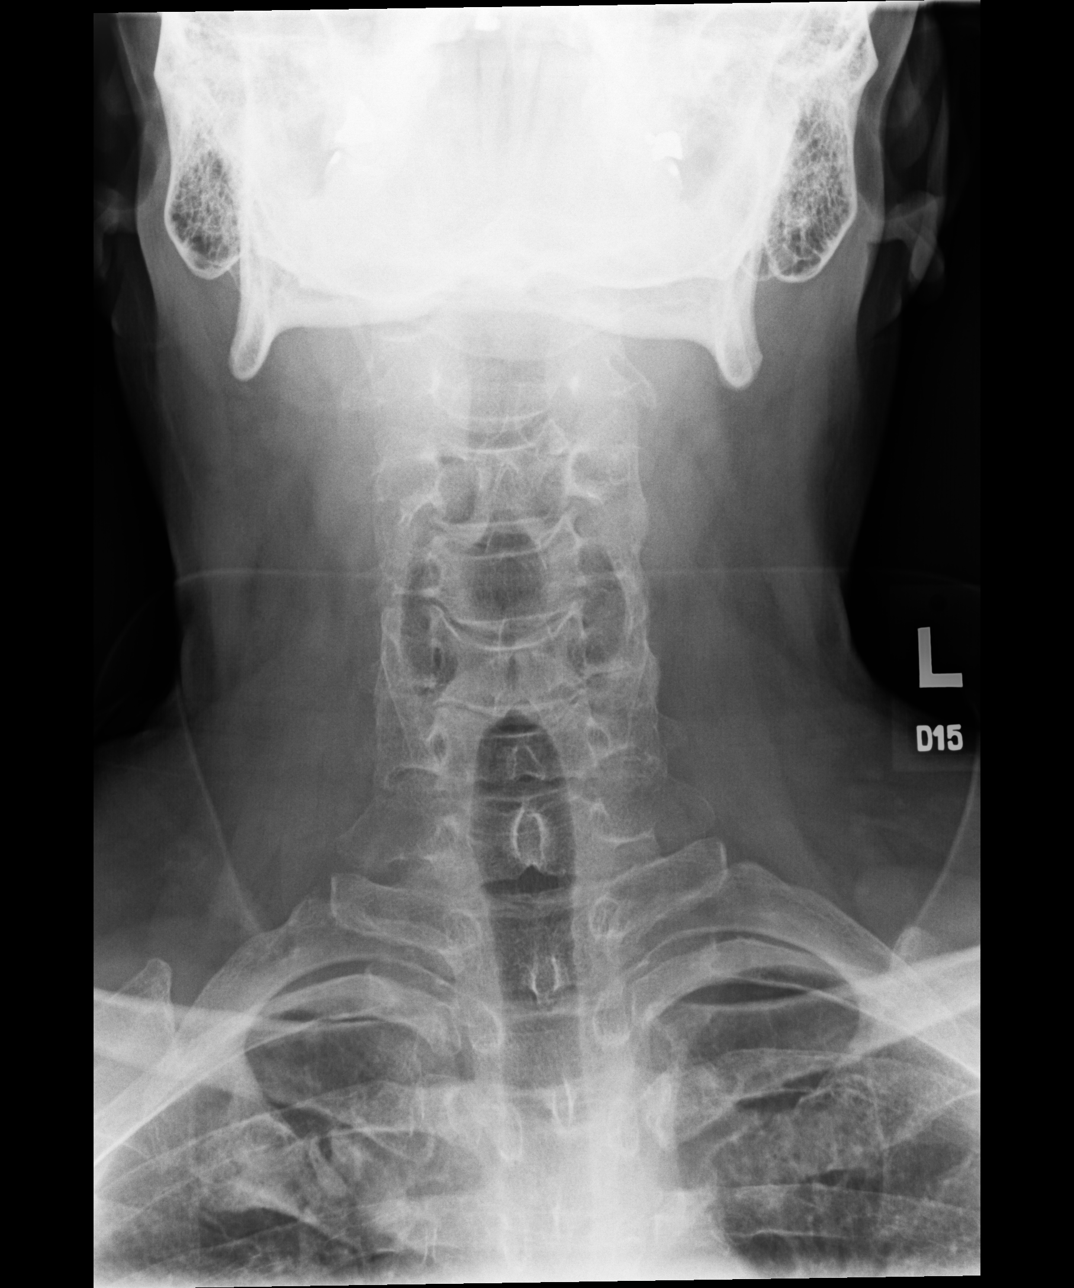

[dg cervical spine 2 or 3 views (3 of 3)]
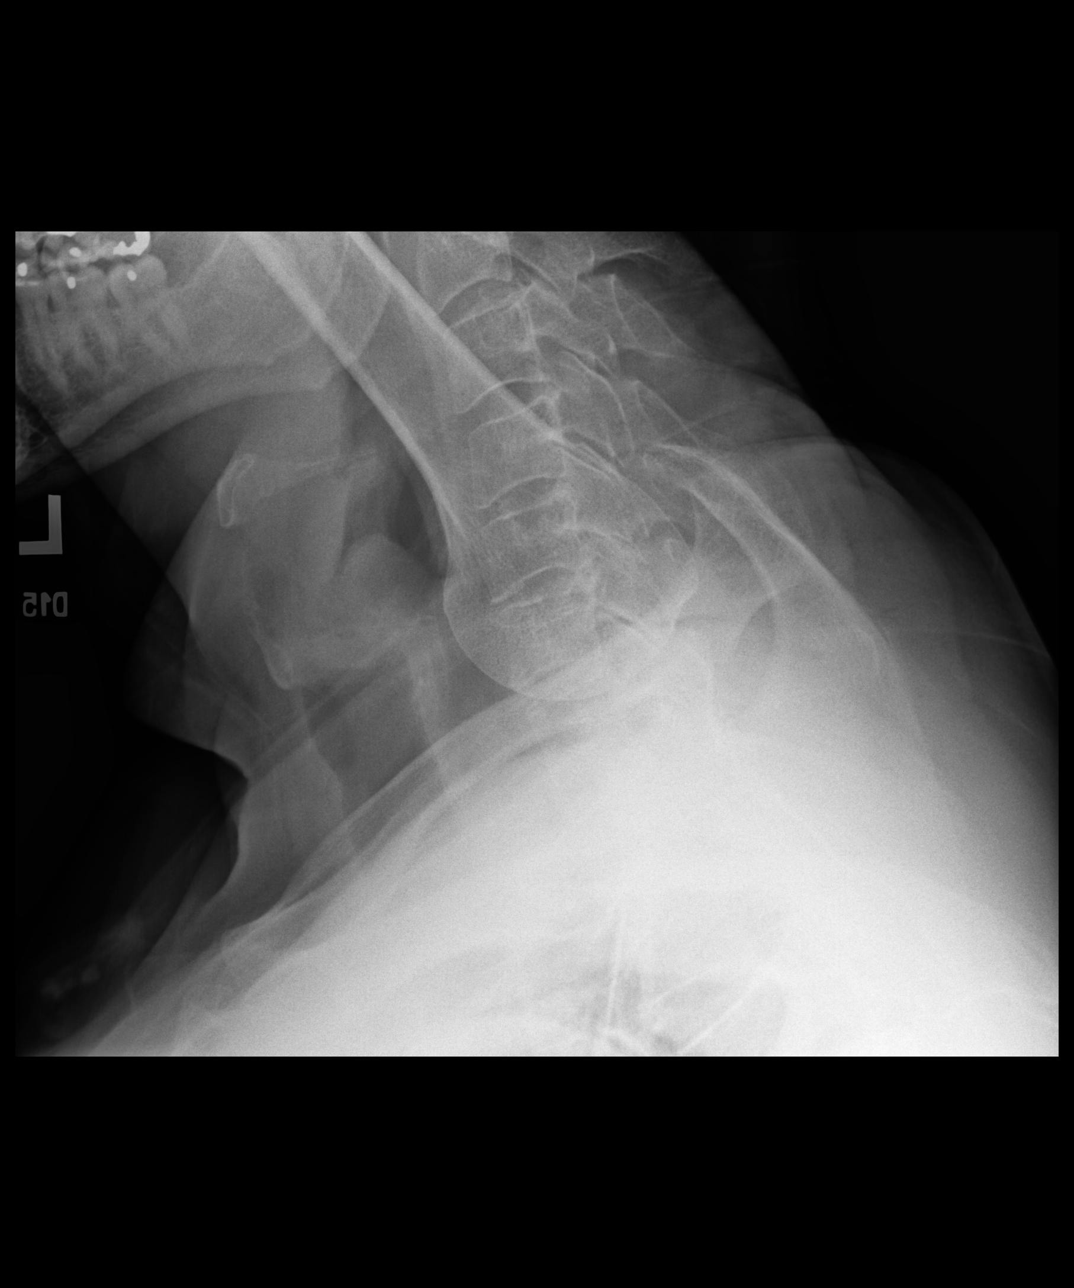

[3 of 3 positions shown; findings below may reference images not displayed]

FINDINGS: C6 and C7 poorly visualized on lateral views. Multilevel lower
cervical disc degeneration endplate osteophyte formation. No
fracture or dislocation identified. Pulmonary apices are clear.
IMPRESSION: C6 and C7 poorly visualized on lateral views. Multilevel lower
cervical disc degeneration and endplate osteophyte formation. No
acute abnormality identified.

## 2022-11-24 NOTE — Progress Notes (Unsigned)
11/26/22- 57 yoM never smoker for sleep evaluation courtesy of Dr Larwance Sachs with concern of OSA. Hx of OSA with CPAP poorly tolerated. ENT Dr Elenore Rota did UPPP, tonsillectomy,septoplasty and reduction of turbinates 2023. Medical problem list includes OSA, GERD, Anxiety/Depression, Hypercholesterolemia,  Epworth score- Body weight today-

## 2022-11-26 ENCOUNTER — Ambulatory Visit: Payer: Managed Care, Other (non HMO) | Admitting: Internal Medicine

## 2022-11-26 ENCOUNTER — Encounter: Payer: Self-pay | Admitting: Internal Medicine

## 2022-11-26 VITALS — BP 112/82 | HR 75 | Ht 72.0 in | Wt 223.0 lb

## 2022-11-26 DIAGNOSIS — G4700 Insomnia, unspecified: Secondary | ICD-10-CM | POA: Insufficient documentation

## 2022-11-26 DIAGNOSIS — F5101 Primary insomnia: Secondary | ICD-10-CM | POA: Diagnosis not present

## 2022-11-26 DIAGNOSIS — G4733 Obstructive sleep apnea (adult) (pediatric): Secondary | ICD-10-CM

## 2022-11-26 NOTE — Assessment & Plan Note (Signed)
Pending f/u HST after extensive ENT surgery in 2023

## 2022-11-26 NOTE — Assessment & Plan Note (Signed)
Always a light sleeper with background anxiety. Plan- emphasize regular sleep schedule with bedtime around 10-11. Suggested CBT. Try adding melatonin to [place] as discussed.

## 2022-11-26 NOTE — Patient Instructions (Addendum)
Suggest regular bedtime  maybe around 10-11 if you are expecting to get up around 8:30 AM  Suggest adding melatonin around 3-5 mg, taking with the ambien about 3 minutes before your intended bedtime.  Phone number for a group that could help with Cognitive Behavioral Therapy for insomnia:    [phone])   We can regroup after you have results from your sleep study.

## 2023-01-10 ENCOUNTER — Telehealth: Payer: Self-pay | Admitting: Nurse Practitioner

## 2023-01-10 NOTE — Telephone Encounter (Addendum)
Pt has ov Monday 12/27/22 and we still do not have his HST  I called and spoke with him  He says Dr Larwance Sachs ordered this and he had it done several wks ago and they were supposed to fax [place] the results   I called [facility] at [phone]   Was transferred to Dr Pilar Plate nurse and placed on hold for 10 min   Spoke with receptionist and she took detailed msg with fax number to my office and will get to nurse, Ashlyn to get the results faxed.

## 2023-01-13 ENCOUNTER — Ambulatory Visit: Payer: Managed Care, Other (non HMO) | Admitting: Nurse Practitioner

## 2023-07-25 ENCOUNTER — Encounter: Payer: Self-pay | Admitting: Internal Medicine

## 2023-10-23 ENCOUNTER — Other Ambulatory Visit: Payer: Self-pay | Admitting: Family Medicine

## 2023-10-23 DIAGNOSIS — R7303 Prediabetes: Secondary | ICD-10-CM

## 2023-10-23 DIAGNOSIS — E7800 Pure hypercholesterolemia, unspecified: Secondary | ICD-10-CM

## 2023-10-23 DIAGNOSIS — R079 Chest pain, unspecified: Secondary | ICD-10-CM

## 2023-10-31 ENCOUNTER — Ambulatory Visit
Admission: RE | Admit: 2023-10-31 | Discharge: 2023-10-31 | Disposition: A | Discharge status: Home / Self Care | Payer: Self-pay | Source: Ambulatory Visit | Attending: Family Medicine | Admitting: Family Medicine

## 2023-10-31 DIAGNOSIS — E7800 Pure hypercholesterolemia, unspecified: Secondary | ICD-10-CM | POA: Insufficient documentation

## 2023-10-31 DIAGNOSIS — R079 Chest pain, unspecified: Secondary | ICD-10-CM | POA: Insufficient documentation

## 2023-10-31 DIAGNOSIS — R7303 Prediabetes: Secondary | ICD-10-CM | POA: Insufficient documentation
